# Patient Record
Sex: Male | Born: 1945 | Race: Black or African American | Hispanic: No | Smoking: Former smoker
Health system: Southern US, Community
[De-identification: ages and names within clinical notes are randomized; demographics above are authoritative.]

## PROBLEM LIST (undated history)

## (undated) DIAGNOSIS — Z992 Dependence on renal dialysis: Secondary | ICD-10-CM

## (undated) DIAGNOSIS — N186 End stage renal disease: Secondary | ICD-10-CM

## (undated) HISTORY — PX: ABOVE KNEE LEG AMPUTATION: SUR20

## (undated) HISTORY — PX: FINGER AMPUTATION: SHX636

---

## 2019-02-20 ENCOUNTER — Other Ambulatory Visit: Payer: Self-pay

## 2019-02-20 ENCOUNTER — Emergency Department (HOSPITAL_COMMUNITY): Payer: Medicare Other

## 2019-02-20 ENCOUNTER — Inpatient Hospital Stay (HOSPITAL_COMMUNITY)
Admission: EM | Admit: 2019-02-20 | Discharge: 2019-02-22 | DRG: 070 | Disposition: A | Discharge status: Skilled Nursing Facility | Payer: Medicare Other | Source: Skilled Nursing Facility | Attending: Internal Medicine | Admitting: Internal Medicine

## 2019-02-20 ENCOUNTER — Encounter (HOSPITAL_COMMUNITY): Payer: Self-pay

## 2019-02-20 DIAGNOSIS — Z23 Encounter for immunization: Secondary | ICD-10-CM | POA: Diagnosis present

## 2019-02-20 DIAGNOSIS — I10 Essential (primary) hypertension: Secondary | ICD-10-CM | POA: Diagnosis not present

## 2019-02-20 DIAGNOSIS — Z992 Dependence on renal dialysis: Secondary | ICD-10-CM | POA: Diagnosis not present

## 2019-02-20 DIAGNOSIS — N2581 Secondary hyperparathyroidism of renal origin: Secondary | ICD-10-CM | POA: Diagnosis present

## 2019-02-20 DIAGNOSIS — D631 Anemia in chronic kidney disease: Secondary | ICD-10-CM | POA: Diagnosis present

## 2019-02-20 DIAGNOSIS — Z7401 Bed confinement status: Secondary | ICD-10-CM | POA: Diagnosis not present

## 2019-02-20 DIAGNOSIS — N186 End stage renal disease: Secondary | ICD-10-CM | POA: Diagnosis present

## 2019-02-20 DIAGNOSIS — N189 Chronic kidney disease, unspecified: Secondary | ICD-10-CM | POA: Diagnosis not present

## 2019-02-20 DIAGNOSIS — Z89612 Acquired absence of left leg above knee: Secondary | ICD-10-CM | POA: Diagnosis not present

## 2019-02-20 DIAGNOSIS — I4821 Permanent atrial fibrillation: Secondary | ICD-10-CM | POA: Diagnosis present

## 2019-02-20 DIAGNOSIS — G3289 Other specified degenerative disorders of nervous system in diseases classified elsewhere: Secondary | ICD-10-CM | POA: Diagnosis present

## 2019-02-20 DIAGNOSIS — E1122 Type 2 diabetes mellitus with diabetic chronic kidney disease: Secondary | ICD-10-CM | POA: Diagnosis present

## 2019-02-20 DIAGNOSIS — Z79899 Other long term (current) drug therapy: Secondary | ICD-10-CM

## 2019-02-20 DIAGNOSIS — U071 COVID-19: Secondary | ICD-10-CM | POA: Diagnosis present

## 2019-02-20 DIAGNOSIS — Z933 Colostomy status: Secondary | ICD-10-CM | POA: Diagnosis not present

## 2019-02-20 DIAGNOSIS — Z87891 Personal history of nicotine dependence: Secondary | ICD-10-CM

## 2019-02-20 DIAGNOSIS — Z833 Family history of diabetes mellitus: Secondary | ICD-10-CM

## 2019-02-20 DIAGNOSIS — F0150 Vascular dementia without behavioral disturbance: Secondary | ICD-10-CM | POA: Diagnosis present

## 2019-02-20 DIAGNOSIS — E1129 Type 2 diabetes mellitus with other diabetic kidney complication: Secondary | ICD-10-CM | POA: Diagnosis present

## 2019-02-20 DIAGNOSIS — G9341 Metabolic encephalopathy: Principal | ICD-10-CM | POA: Diagnosis present

## 2019-02-20 DIAGNOSIS — R5381 Other malaise: Secondary | ICD-10-CM | POA: Diagnosis present

## 2019-02-20 DIAGNOSIS — Z7901 Long term (current) use of anticoagulants: Secondary | ICD-10-CM | POA: Diagnosis not present

## 2019-02-20 DIAGNOSIS — E875 Hyperkalemia: Secondary | ICD-10-CM | POA: Diagnosis present

## 2019-02-20 DIAGNOSIS — F0151 Vascular dementia with behavioral disturbance: Secondary | ICD-10-CM | POA: Diagnosis not present

## 2019-02-20 DIAGNOSIS — I120 Hypertensive chronic kidney disease with stage 5 chronic kidney disease or end stage renal disease: Secondary | ICD-10-CM | POA: Diagnosis present

## 2019-02-20 DIAGNOSIS — I69354 Hemiplegia and hemiparesis following cerebral infarction affecting left non-dominant side: Secondary | ICD-10-CM | POA: Diagnosis not present

## 2019-02-20 DIAGNOSIS — R410 Disorientation, unspecified: Secondary | ICD-10-CM

## 2019-02-20 DIAGNOSIS — Z89021 Acquired absence of right finger(s): Secondary | ICD-10-CM | POA: Diagnosis not present

## 2019-02-20 DIAGNOSIS — F1011 Alcohol abuse, in remission: Secondary | ICD-10-CM | POA: Diagnosis present

## 2019-02-20 DIAGNOSIS — R4182 Altered mental status, unspecified: Secondary | ICD-10-CM | POA: Diagnosis present

## 2019-02-20 DIAGNOSIS — R7989 Other specified abnormal findings of blood chemistry: Secondary | ICD-10-CM | POA: Diagnosis present

## 2019-02-20 DIAGNOSIS — I248 Other forms of acute ischemic heart disease: Secondary | ICD-10-CM | POA: Diagnosis present

## 2019-02-20 DIAGNOSIS — G40909 Epilepsy, unspecified, not intractable, without status epilepticus: Secondary | ICD-10-CM | POA: Diagnosis present

## 2019-02-20 DIAGNOSIS — R778 Other specified abnormalities of plasma proteins: Secondary | ICD-10-CM | POA: Diagnosis not present

## 2019-02-20 HISTORY — DX: End stage renal disease: N18.6

## 2019-02-20 HISTORY — DX: Dependence on renal dialysis: Z99.2

## 2019-02-20 LAB — COMPREHENSIVE METABOLIC PANEL
ALT: 25 U/L (ref 0–44)
AST: 12 U/L — ABNORMAL LOW (ref 15–41)
Albumin: 3.7 g/dL (ref 3.5–5.0)
Alkaline Phosphatase: 58 U/L (ref 38–126)
Anion gap: 17 — ABNORMAL HIGH (ref 5–15)
BUN: 115 mg/dL — ABNORMAL HIGH (ref 8–23)
CO2: 17 mmol/L — ABNORMAL LOW (ref 22–32)
Calcium: 8.4 mg/dL — ABNORMAL LOW (ref 8.9–10.3)
Chloride: 105 mmol/L (ref 98–111)
Creatinine, Ser: 13.82 mg/dL — ABNORMAL HIGH (ref 0.61–1.24)
GFR calc Af Amer: 4 mL/min — ABNORMAL LOW (ref >60)
GFR calc non Af Amer: 3 mL/min — ABNORMAL LOW (ref >60)
Glucose, Bld: 91 mg/dL (ref 70–99)
Potassium: 5.6 mmol/L — ABNORMAL HIGH (ref 3.5–5.1)
Sodium: 139 mmol/L (ref 135–145)
Total Bilirubin: 0.8 mg/dL (ref 0.3–1.2)
Total Protein: 7.5 g/dL (ref 6.5–8.1)

## 2019-02-20 LAB — CBC WITH DIFFERENTIAL/PLATELET
Abs Immature Granulocytes: 0.02 10*3/uL (ref 0.00–0.07)
Basophils Absolute: 0 10*3/uL (ref 0.0–0.1)
Basophils Relative: 1 %
Eosinophils Absolute: 0.1 10*3/uL (ref 0.0–0.5)
Eosinophils Relative: 4 %
HCT: 28.1 % — ABNORMAL LOW (ref 39.0–52.0)
Hemoglobin: 8.9 g/dL — ABNORMAL LOW (ref 13.0–17.0)
Immature Granulocytes: 1 %
Lymphocytes Relative: 37 %
Lymphs Abs: 1.3 10*3/uL (ref 0.7–4.0)
MCH: 31.3 pg (ref 26.0–34.0)
MCHC: 31.7 g/dL (ref 30.0–36.0)
MCV: 98.9 fL (ref 80.0–100.0)
Monocytes Absolute: 0.3 10*3/uL (ref 0.1–1.0)
Monocytes Relative: 8 %
Neutro Abs: 1.8 10*3/uL (ref 1.7–7.7)
Neutrophils Relative %: 49 %
Platelets: 154 10*3/uL (ref 150–400)
RBC: 2.84 MIL/uL — ABNORMAL LOW (ref 4.22–5.81)
RDW: 13.9 % (ref 11.5–15.5)
WBC: 3.5 10*3/uL — ABNORMAL LOW (ref 4.0–10.5)
nRBC: 0 % (ref 0.0–0.2)

## 2019-02-20 LAB — I-STAT CHEM 8, ED
BUN: 116 mg/dL — ABNORMAL HIGH (ref 8–23)
Calcium, Ion: 0.92 mmol/L — ABNORMAL LOW (ref 1.15–1.40)
Chloride: 109 mmol/L (ref 98–111)
Creatinine, Ser: 14.2 mg/dL — ABNORMAL HIGH (ref 0.61–1.24)
Glucose, Bld: 87 mg/dL (ref 70–99)
HCT: 28 % — ABNORMAL LOW (ref 39.0–52.0)
Hemoglobin: 9.5 g/dL — ABNORMAL LOW (ref 13.0–17.0)
Potassium: 5.3 mmol/L — ABNORMAL HIGH (ref 3.5–5.1)
Sodium: 138 mmol/L (ref 135–145)
TCO2: 15 mmol/L — ABNORMAL LOW (ref 22–32)

## 2019-02-20 LAB — PHOSPHORUS: Phosphorus: 5.5 mg/dL — ABNORMAL HIGH (ref 2.5–4.6)

## 2019-02-20 LAB — PROTIME-INR
INR: 1.2 (ref 0.8–1.2)
Prothrombin Time: 14.7 seconds (ref 11.4–15.2)

## 2019-02-20 LAB — LACTIC ACID, PLASMA: Lactic Acid, Venous: 1 mmol/L (ref 0.5–1.9)

## 2019-02-20 LAB — MAGNESIUM: Magnesium: 2.1 mg/dL (ref 1.7–2.4)

## 2019-02-20 LAB — SARS CORONAVIRUS 2 (TAT 6-24 HRS): SARS Coronavirus 2: POSITIVE — AB

## 2019-02-20 LAB — ETHANOL: Alcohol, Ethyl (B): <10 mg/dL (ref <10)

## 2019-02-20 LAB — TROPONIN I (HIGH SENSITIVITY): Troponin I (High Sensitivity): 19 ng/L — ABNORMAL HIGH (ref <18)

## 2019-02-20 MED ORDER — ENOXAPARIN SODIUM 30 MG/0.3ML ~~LOC~~ SOLN
30.0000 mg | Freq: Every day | SUBCUTANEOUS | Status: DC
  Filled 2019-02-20: qty 0.3

## 2019-02-20 MED ORDER — ACETAMINOPHEN 325 MG PO TABS: 650.0000 mg | ORAL_TABLET | Freq: Four times a day (QID) | ORAL | Status: DC | PRN

## 2019-02-20 MED ORDER — ONDANSETRON HCL 4 MG PO TABS: 4.0000 mg | ORAL_TABLET | Freq: Four times a day (QID) | ORAL | Status: DC | PRN

## 2019-02-20 MED ORDER — ALBUTEROL SULFATE HFA 108 (90 BASE) MCG/ACT IN AERS
2.0000 | INHALATION_SPRAY | Freq: Four times a day (QID) | RESPIRATORY_TRACT | Status: DC
  Administered 2019-02-21 – 2019-02-22 (×6): 2 via RESPIRATORY_TRACT
  Filled 2019-02-20 (×3): qty 6.7

## 2019-02-20 MED ORDER — SODIUM CHLORIDE 0.9% FLUSH: 3.0000 mL | INTRAVENOUS | Status: DC | PRN

## 2019-02-20 MED ORDER — SODIUM CHLORIDE 0.9% FLUSH
3.0000 mL | Freq: Two times a day (BID) | INTRAVENOUS | Status: DC
  Administered 2019-02-20: 22:00:00 3 mL via INTRAVENOUS

## 2019-02-20 MED ORDER — ONDANSETRON HCL 4 MG/2ML IJ SOLN: 4.0000 mg | Freq: Four times a day (QID) | INTRAMUSCULAR | Status: DC | PRN

## 2019-02-20 MED ORDER — SODIUM ZIRCONIUM CYCLOSILICATE 10 G PO PACK
10.0000 g | PACK | Freq: Once | ORAL | Status: AC
  Administered 2019-02-20: 10 g via ORAL
  Filled 2019-02-20: qty 1

## 2019-02-20 MED ORDER — HYDROCODONE-ACETAMINOPHEN 5-325 MG PO TABS: 1.0000 | ORAL_TABLET | ORAL | Status: DC | PRN

## 2019-02-20 MED ORDER — SODIUM CHLORIDE 0.9% FLUSH
3.0000 mL | Freq: Two times a day (BID) | INTRAVENOUS | Status: DC
  Administered 2019-02-20 – 2019-02-22 (×4): 3 mL via INTRAVENOUS

## 2019-02-20 MED ORDER — GUAIFENESIN-DM 100-10 MG/5ML PO SYRP: 10.0000 mL | ORAL_SOLUTION | ORAL | Status: DC | PRN

## 2019-02-20 MED ORDER — SODIUM CHLORIDE 0.9 % IV SOLN: 250.0000 mL | INTRAVENOUS | Status: DC | PRN

## 2019-02-20 MED ORDER — INSULIN ASPART 100 UNIT/ML ~~LOC~~ SOLN
0.0000 [IU] | SUBCUTANEOUS | Status: DC
  Administered 2019-02-21: 1 [IU] via SUBCUTANEOUS

## 2019-02-20 NOTE — ED Triage Notes (Signed)
Patient arrives via EMS from [facility] [facility]. EMS was initially called for patient to be transported to dialysis, however, while in transport patient was noted to be altered, unable to sit/bend, and would not keep his mask on (COVID+). Tues/Thurs/Sat dialysis schedule.  Patient was transported here for dialysis treatment. A&O x1 on arrival.

## 2019-02-20 NOTE — ED Provider Notes (Addendum)
[facility] EMERGENCY DEPARTMENT Provider Note   CSN: [id number] Arrival date & time: 02/20/19  1400     History   Chief Complaint Chief Complaint  Patient presents with  . Altered Mental Status  . COVID  . Dialysis    HPI Logan Singh is a 73 y.o. male.     HPI Patient is sent with very limited amount of history.  He was at [facility].  Reportedly he is a new resident there and only has been there for 4 days.  He has ESRD and is on dialysis.  Apparently he missed dialysis Thursday for unknown reasons.  He was being transported today when during transport it was determined that his mental status was altered and he was brought to the emergency department.  Patient reportedly had COVID.  Unknown when that testing was.  At this time, there is not additional information in the current EMR.  It is unclear right now where he was hospitalized and subsequently transferred to [facility].  The patient is a poor historian.  He denies that he is having any pain.  He reports he does not really know where we are.  He is not sure if this is the hospital.  He thinks it might be January.  He is talking and answering questions but it is unclear if he has some dementia versus  delirium.  He is talking about how things were when he was in school.  His sentences are clear but not relevant to the current situation.  He clearly is objecting to having IV sticks and vocalizing clear and normal commentary. Past Medical History:  Diagnosis Date  . Dialysis patient ([place])   . End-stage renal disease (ESRD) ([place])     There are no active problems to display for this patient.   Past Surgical History:  Procedure Laterality Date  . ABOVE KNEE LEG AMPUTATION Left   . FINGER AMPUTATION Right         Home Medications    Prior to Admission medications   Not on File    Family History History reviewed. No pertinent family history.  Social History Social History    Tobacco Use  . Smoking status: Former Research scientist (life sciences)  . Smokeless tobacco: Never Used  Substance Use Topics  . Alcohol use: Not on file  . Drug use: Not on file     Allergies   Patient has no known allergies.   Review of Systems Review of Systems Level 5 caveat cannot obtain review of systems due to patient confusion or dementia.  Physical Exam Updated Vital Signs BP (!) 197/66 (BP Location: Left Arm)   Pulse 74   Temp 97.9 F (36.6 C) (Oral)   Resp (!) 22   Ht 5\' 10"  (1.778 m)   Wt 72.6 kg   SpO2 98%   BMI 22.96 kg/m   Physical Exam Constitutional:      Comments: Patient is alert.  He is not in any respiratory distress.  He is talking in a normal voice.  He does appear deconditioned, thin and debilitated.  HENT:     Head: Normocephalic and atraumatic.     Mouth/Throat:     Mouth: Mucous membranes are moist.     Pharynx: Oropharynx is clear.  Eyes:     Extraocular Movements: Extraocular movements intact.  Cardiovascular:     Comments: Heart sounds distant.  Regular.  I cannot appreciate rub murmur gallop at this time.  Patient is however  talking fairly constantly. Pulmonary:     Comments: No respiratory distress.  To anterior auscultation no gross rales or crackle.  The patient will not sit up for his exam.  He is physically stiffening his body and not allowing the nurse as an Environmental consultant and myself to put him in a sitting position in an upright position.  Will need additional staff to forcibly roll him up on his side. Abdominal:     Comments: Abdomen is soft and nondistended.  He has multiple old scars.  He does not describe pain or objective abdominal exam.  Musculoskeletal:     Comments: AKA on the left.  Right lower extremity is present.  Very thin.  Skin is thinned and trace edema.  Musculature of the lower extremity is very atrophic.  Skin:    General: Skin is warm and dry.  Neurological:     Comments: Patient is aware of his surroundings.  He is communicating with  myself and his nurse.  He is not appropriately answering any historical questions.  He can purposefully move both upper extremities.  He does not have any obvious focal motor deficit but he is very deconditioned with a lower extremity amputation, arthritis and general atrophy of extremity musculature.      ED Treatments / Results  Labs (all labs ordered are listed, but only abnormal results are displayed) Labs Reviewed  CULTURE, BLOOD (ROUTINE X 2)  CULTURE, BLOOD (ROUTINE X 2)  SARS CORONAVIRUS 2 (TAT 6-24 HRS)  COMPREHENSIVE METABOLIC PANEL  ETHANOL  LACTIC ACID, PLASMA  LACTIC ACID, PLASMA  CBC WITH DIFFERENTIAL/PLATELET  PROTIME-INR  MAGNESIUM  PHOSPHORUS  I-STAT CHEM 8, ED  TROPONIN I (HIGH SENSITIVITY)    EKG None  Radiology No results found.  Procedures Procedures (including critical care time)  Medications Ordered in ED Medications - No data to display   Initial Impression / Assessment and Plan / ED Course  I have reviewed the triage vital signs and the nursing notes.  Pertinent labs & imaging results that were available during my care of the patient were reviewed by me and considered in my medical decision making (see chart for details).       Patient is brought to the emergency department with very limited history at this time.  He is alert without respiratory distress.  He is ostensibly on route to dialysis.  Report is that he is or has been positive for COVID.  Will initiate general work-up.  Will need to try to obtain more history from SNF facility.  Reportedly the patient had altered mental status but it is unclear what the baseline mental status is.  Final Clinical Impressions(s) / ED Diagnoses   Final diagnoses:  ESRD needing dialysis [facility])  Confusion    ED Discharge Orders    None       Charlesetta Shanks, MD 02/20/19 1440    Charlesetta Shanks, MD 02/20/19 1443

## 2019-02-20 NOTE — ED Notes (Signed)
Spoke to [employer]. Patient's home facility is [facility]. [facility] days ago due to [place] have a Covid Virus outbreak.   Brother[place]: [id number]

## 2019-02-20 NOTE — H&P (Signed)
[place] [id number] DOB: [birth year] DOA: 02/20/2019     PCP: Mendel Corning   Outpatient Specialists:      Patient arrived to ER on 02/20/19 at 1400  Patient coming from:   From facility [facility] nursing facility   Chief Complaint:   Chief Complaint  Patient presents with   Altered Mental Status   COVID   Dialysis    HPI: Logan Singh is a 73 y.o. male with medical history significant of COVID positive on ESRD Tue/Thurs/Sat, DM2, HTN, stroke, Dementia, sp colostostomy, sp left AKA    Presented with   Acute encephalopathy  Originally from [place] was at [facility] due to CVA with residual weakness on the left, at baseline bed bound, Diagnosed COVID yesterday per family he was transferred to [facility] Patient was on his way to receive his regular dialysis today as it is Saturday but EMS noted that he was very confused which family states he has been having intermittent episodes of confusion ever since his stroke.  EMS turned around and brought patient here to [facility] Hx of tobacco abuse and ETOH abuse non since 2017   Infectious risk factors:  Reports none  POSITIVE from [facility] yesterday in  ER RAPID COVID TEST Positive  Lab Results  Component Value Date   [place] (A) 02/20/2019     Regarding pertinent Chronic problems:    HTN on unsure what medications he takes    DM 2 - unclear if on insulin  Hx of CVA -  With residual deficits      ESRD - on HD since 2015 on [place], [place], [place] Lab Results  Component Value Date   CREATININE 14.20 (H) 02/20/2019   CREATININE 13.82 (H) 02/20/2019    While in ER: Noted to be hyperkalemic was treated nephrology was consulted plan to dialyze tomorrow plan to admit for further work-up and treatment of COVID The following Work up has been ordered so far:  Orders Placed This Encounter  Procedures   Culture, blood (routine x 2)   SARS CORONAVIRUS 2 (TAT 6-24 HRS) Nasopharyngeal  Nasopharyngeal Swab   DG Chest Port 1 View   CT Head Wo Contrast   Comprehensive metabolic panel   Ethanol   Lactic acid, plasma   CBC with Differential   Protime-INR   Magnesium   Phosphorus   Consult to nephrology  ALL PATIENTS BEING ADMITTED/HAVING PROCEDURES NEED COVID-19 SCREENING   Consult for Unassigned Medical Admission  ALL PATIENTS BEING ADMITTED/HAVING PROCEDURES NEED COVID-19 SCREENING   I-stat chem 8, ED (not at [facility] or [facility])   Saline lock IV     Following Medications were ordered in ER: Medications  sodium zirconium cyclosilicate (LOKELMA) packet 10 g (has no administration in time range)      ER Provider Called:     Dr.[place]  They Recommend admit to medicine  Will see in AM   Significant initial  Findings: Abnormal Labs Reviewed  COMPREHENSIVE METABOLIC PANEL - Abnormal; Notable for the following components:      Result Value   Potassium 5.6 (*)    CO2 17 (*)    BUN 115 (*)    Creatinine, Ser 13.82 (*)    Calcium 8.4 (*)    AST 12 (*)    GFR calc non Af Amer 3 (*)    GFR calc Af Amer 4 (*)    Anion gap 17 (*)    All other components within normal limits  CBC WITH DIFFERENTIAL/PLATELET - Abnormal; Notable for the following components:   WBC 3.5 (*)    RBC 2.84 (*)    Hemoglobin 8.9 (*)    HCT 28.1 (*)    All other components within normal limits  PHOSPHORUS - Abnormal; Notable for the following components:   Phosphorus 5.5 (*)    All other components within normal limits  I-STAT CHEM 8, ED - Abnormal; Notable for the following components:   Potassium 5.3 (*)    BUN 116 (*)    Creatinine, Ser 14.20 (*)    Calcium, Ion 0.92 (*)    TCO2 15 (*)    Hemoglobin 9.5 (*)    HCT 28.0 (*)    All other components within normal limits  TROPONIN I (HIGH SENSITIVITY) - Abnormal; Notable for the following components:   Troponin I (High Sensitivity) 19 (*)    All other components within normal limits     Otherwise labs showing:    Recent  Labs  Lab 02/20/19 1821 02/20/19 1833  NA 139 138  K 5.6* 5.3*  CO2 17*  --   GLUCOSE 91 87  BUN 115* 116*  CREATININE 13.82* 14.20*  CALCIUM 8.4*  --   MG 2.1  --   PHOS 5.5*  --     Cr  Up from baseline see below Lab Results  Component Value Date   CREATININE 14.20 (H) 02/20/2019   CREATININE 13.82 (H) 02/20/2019    Recent Labs  Lab 02/20/19 1821  AST 12*  ALT 25  ALKPHOS 58  BILITOT 0.8  PROT 7.5  ALBUMIN 3.7   Lab Results  Component Value Date   CALCIUM 8.4 (L) 02/20/2019   PHOS 5.5 (H) 02/20/2019     WBC      Component Value Date/Time   WBC 3.5 (L) 02/20/2019 1821   ANC    Component Value Date/Time   NEUTROABS 1.8 02/20/2019 1821   ALC No components found for: LYMPHAB    Plt: Lab Results  Component Value Date   PLT 154 02/20/2019     Lactic Acid, Venous    Component Value Date/Time   LATICACIDVEN 1.0 02/20/2019 1821    Procalcitonin  Ordered   COVID-19 Labs  No results for input(s): DDIMER, FERRITIN, LDH, CRP in the last 72 hours.  No results found for: SARSCOV2NAA    HG/HCT  stable,       Component Value Date/Time   HGB 9.5 (L) 02/20/2019 1833   HCT 28.0 (L) 02/20/2019 1833    Troponin  19     ECG: Ordered Personally reviewed by me showing: HR : 69  Rhythm:   NSR    no evidence of ischemic changes QTC 504    UA anuric       Ordered  CT HEAD remote right MCA territory infarct.   CXR -  NON acute     ED Triage Vitals  Enc Vitals Group     BP 02/20/19 1407 (!) 197/66     Pulse Rate 02/20/19 1407 74     Resp 02/20/19 1407 (!) 22     Temp 02/20/19 1407 97.9 F (36.6 C)     Temp Source 02/20/19 1407 Oral     SpO2 02/20/19 1407 98 %     Weight 02/20/19 1408 160 lb (72.6 kg)     Height 02/20/19 1408 5\' 10"  (1.778 m)     Head Circumference --      Peak Flow --  Pain Score 02/20/19 1415 0     Pain Loc --      Pain Edu? --      Excl. in [place]? --   TMAX(24)@       Latest  Blood pressure (!) 159/76,  pulse 65, temperature 97.9 F (36.6 C), temperature source Oral, resp. rate 14, height 5\' 10"  (1.778 m), weight 72.6 kg, SpO2 98 %.    Hospitalist was called for admission for Need for HD   Review of Systems:    Pertinent positives include: confusion, fatigue  Constitutional:  No weight loss, night sweats, Fevers, chills, weight loss  HEENT:  No headaches, Difficulty swallowing,Tooth/dental problems,Sore throat,  No sneezing, itching, ear ache, nasal congestion, post nasal drip,  Cardio-vascular:  No chest pain, Orthopnea, PND, anasarca, dizziness, palpitations.no Bilateral lower extremity swelling  GI:  No heartburn, indigestion, abdominal pain, nausea, vomiting, diarrhea, change in bowel habits, loss of appetite, melena, blood in stool, hematemesis Resp:  no shortness of breath at rest. No dyspnea on exertion, No excess mucus, no productive cough, No non-productive cough, No coughing up of blood.No change in color of mucus.No wheezing. Skin:  no rash or lesions. No jaundice GU:  no dysuria, change in color of urine, no urgency or frequency. No straining to urinate.  No flank pain.  Musculoskeletal:  No joint pain or no joint swelling. No decreased range of motion. No back pain.  Psych:  No change in mood or affect. No depression or anxiety. No memory loss.  Neuro: no localizing neurological complaints, no tingling, no weakness, no double vision, no gait abnormality, no slurred speech,   All systems reviewed and apart from [place] all are negative  Past Medical History:   Past Medical History:  Diagnosis Date   Dialysis patient ([place])    End-stage renal disease (ESRD) ([place])       Past Surgical History:  Procedure Laterality Date   ABOVE KNEE LEG AMPUTATION Left    FINGER AMPUTATION Right     Social History:  Ambulatory   bed bound     reports that he has quit smoking. He has never used smokeless tobacco. No history on file for alcohol and drug.   Family  History:   Family History  Problem Relation Age of Onset   Diabetes Other     Allergies: No Known Allergies   Prior to Admission medications   Not on File   Physical Exam: Blood pressure (!) 159/76, pulse 65, temperature 97.9 F (36.6 C), temperature source Oral, resp. rate 14, height 5\' 10"  (1.778 m), weight 72.6 kg, SpO2 98 %. 1. General:  in No  Acute distress   Chronically ill  -appearing 2. Psychological: Alert and not Oriented 3. Head/ENT:   Moist   Mucous Membranes                          Head Non traumatic, neck supple                           Poor Dentition 4. SKIN:  decreased Skin turgor,  Skin clean Dry and intact no rash 5. Heart: Regular rate and rhythm no Murmur, no Rub or gallop 6. Lungs: no wheezes or crackles   7. Abdomen: Soft,  non-tender, Non distended   bowel sounds present 8. Lower extremities: no clubbing, cyanosis, no  Edema sp Left AKA 9. Neurologically not following commands appears to be  pleasantly confused Spontaneously moving all 3 extremities 10. MSK: Normal range of motion   All other LABS:     Recent Labs  Lab 02/20/19 1821 02/20/19 1833  WBC 3.5*  --   NEUTROABS 1.8  --   HGB 8.9* 9.5*  HCT 28.1* 28.0*  MCV 98.9  --   PLT 154  --      Recent Labs  Lab 02/20/19 1821 02/20/19 1833  NA 139 138  K 5.6* 5.3*  CL 105 109  CO2 17*  --   GLUCOSE 91 87  BUN 115* 116*  CREATININE 13.82* 14.20*  CALCIUM 8.4*  --   MG 2.1  --   PHOS 5.5*  --      Recent Labs  Lab 02/20/19 1821  AST 12*  ALT 25  ALKPHOS 58  BILITOT 0.8  PROT 7.5  ALBUMIN 3.7       Cultures: No results found for: SDES, SPECREQUEST, CULT, REPTSTATUS   Radiological Exams on Admission: Ct Head Wo Contrast  Result Date: 02/20/2019 CLINICAL DATA:  Altered level of consciousness today. EXAM: CT HEAD WITHOUT CONTRAST TECHNIQUE: Contiguous axial images were obtained from the base of the skull through the vertex without intravenous contrast. COMPARISON:   None. FINDINGS: Brain: No evidence of acute infarction, hemorrhage, hydrocephalus, extra-axial collection or mass lesion/mass effect. Atrophy, extensive chronic microvascular ischemic change and remote large right MCA infarct noted. Vascular: No hyperdense vessel or unexpected calcification. Skull: No acute or focal abnormality. Sinuses/Orbits: Negative. Other: None. IMPRESSION: No acute abnormality. Atrophy, chronic microvascular ischemic change and large, remote right MCA territory infarct. Electronically Signed   By: Inge Rise M.D.   On: 02/20/2019 16:55   Dg Chest Port 1 View  Result Date: 02/20/2019 CLINICAL DATA:  The patient missed dialysis 2 days ago. EXAM: PORTABLE CHEST 1 VIEW COMPARISON:  None. FINDINGS: Lungs clear. Heart size normal. No pneumothorax or pleural fluid. No acute or focal bony abnormality. IMPRESSION: No acute disease. Electronically Signed   By: Inge Rise M.D.   On: 02/20/2019 16:53    Chart has been reviewed    Assessment/Plan   73 y.o. male with medical history significant of COVID positive on ESRD Tue/Thurs/Sat, DM2, HTN, stroke, Dementia, sp colostomy, sp LEFT AKA  Admitted for acute encephalopathy in the setting of COVID  Present on Admission:  COVID-19 virus infection -   FROM SNF WITH KNOWN HX OF COVID19 Initial Rapid Novel Corona Virus testing:  Ordered 02/20/19 and is pending     -Following work-up initiated:        sputum cultures  Ordered 02/20/19, Blood cultures  Ordered 02/20/19, repeat  Novel Corona Virus testing:  Ordered      Following complications noted:  elevated troponin noted likely demand ischemia for clearance in the setting of end-stage renal disease will continue to follow  will be monitoring for evidence of other complications such as PE/DVT CVA or  cardiovascular events  Plan of treatment: -Admit to [facility] given the patient is dialysis - if develops hypoxia would initiate steroids Solu-Medrol and  remdesivir  currently on room air - Will follow daily d.dimer - Assess for ability to prone  - Supportive management    -Provide oxygen as needed currently on   SpO2: 98 % - IF d.dimer elvated >5 will increase dose of lovenox    Poor Prognostic factors  73 y.o.  Personal hx of DM2, , HTN,   Evidence of  organ damage  Present  elevated  trop   Airborne precautions ordered     Will Lobbyist precautions  Family/ patient prognosis discussion:  I have asked case with the family/ patient  who are aware of patient's prognosis At this point they would like patient to be full code     ESRD (end stage renal disease) ([place]) -nephrology aware will plan to dialyze tomorrow   Hyperkalemia -treated in the emergency department will continue monitor on telemetry plan to dialyze tomorrow   Vascular dementia ([place]) -unclear if current worsening in the setting of COVID versus ex sundowning in the setting of chronic dementia we will continue to monitor   Anemia due to chronic kidney disease -check anemia panel   DM (diabetes mellitus) type II controlled with renal manifestation ([place]) -   - Order Sensitive SSI   -  check TSH and HgA1C Will need to obtain medication list from [place] hypertension -resume home medications when confirmed from [facility] In the past possibly was on hydralazine 25 mg metoprolol 50 mg amlodipine 10 mg isosorbide dinitrate 20 mg Also propranolol 10 mg unsure which of those medications are currently active   Acute metabolic encephalopathy-most likely sundowning in the setting of chronic vascular dementia. P review of records he at some point has been on 500 mg of Keppra unsure if once a day twice a day family did not mention any history of seizure but if patient has history of seizures this needs to be restarted   Elevated troponin-no chest pain patient with known history of end-stage renal disease likely for clearance no evidence of  ischemia at this time   Other plan as per orders.  DVT prophylaxis:   Lovenox     Code Status:  FULL CODE   as per family  I had personally discussed CODE STATUS with   Family     Family Communication:   Family not at  Bedside  plan of care was discussed on the phone with   Brother ,   Disposition Plan:                             Back to current facility when stable                                                                Social Work  consulted                                  Consults called: nephrology  Admission status:  ED Disposition    None          inpatient     Expect 2 midnight stay secondary to severity of patient's current illness including     Severe lab/radiological/exam abnormalities including:     and extensive comorbidities including:    DM2      CKD/ESRD  dementia    history of stroke with residual deficits     That are currently affecting medical management.   I expect  patient to be hospitalized for 2 midnights requiring inpatient medical care.  Patient is at high risk for adverse outcome (such as loss  of life or disability) if not treated.  Indication for inpatient stay as follows:  Severe change from baseline regarding mental status   Diminished ability to maintain oral hydration    New or worsening hypoxia  Need for HD     Level of care     tele  For 24H     Precautions:   airborne Airborne and Contact precautions  PPE: Used by the provider:   P100  eye Goggles,  Gloves  gown      Somaya Grassi 02/21/2019, 1:18 AM    [facility]     after 2 AM please page floor coverage PA If 7AM-7PM, please contact the day team taking care of the patient using [email or web address]

## 2019-02-20 NOTE — ED Notes (Signed)
Got patient on the monitor did ekg shown to Dr Edman Circle patient is resting with call bell in reach

## 2019-02-20 NOTE — ED Notes (Addendum)
Attempted to draw blood/place IV x2 with no success. IV team consulted.

## 2019-02-20 NOTE — ED Notes (Signed)
Attempted to give medication, patient stated "I don't want no fucking more" after three swallows of medication.

## 2019-02-20 NOTE — ED Provider Notes (Signed)
  Physical Exam  BP (!) 159/76   Pulse 65   Temp 97.9 F (36.6 C) (Oral)   Resp 14   Ht 5\' 10"  (1.778 m)   Wt 72.6 kg   SpO2 98%   BMI 22.96 kg/m   Physical Exam  ED Course/Procedures     Procedures  MDM  Received patient in signout.  Was being sent from [place] facility to dialysis.  Reportedly had mental status change brought in by EMS.  Reportedly is covid positive.  Has only been at [facility] for a few days.  Reportedly was not dialyzed on Thursday either.  Mild hyperkalemia.  X-ray reassuring.  Too confused to really writing much history.  Will discuss with nephrology and likely admission the hospital.  Discussed with Dr. Moshe Cipro.  Will give Lokelma 10 mg here.  Likely dialysis tomorrow.  Admit to internal medicine.      Davonna Belling, MD 02/20/19 2107

## 2019-02-21 DIAGNOSIS — Z89612 Acquired absence of left leg above knee: Secondary | ICD-10-CM

## 2019-02-21 DIAGNOSIS — G9341 Metabolic encephalopathy: Principal | ICD-10-CM

## 2019-02-21 LAB — CBC
HCT: 25 % — ABNORMAL LOW (ref 39.0–52.0)
Hemoglobin: 8.7 g/dL — ABNORMAL LOW (ref 13.0–17.0)
MCH: 32.3 pg (ref 26.0–34.0)
MCHC: 34.8 g/dL (ref 30.0–36.0)
MCV: 92.9 fL (ref 80.0–100.0)
Platelets: 157 10*3/uL (ref 150–400)
RBC: 2.69 MIL/uL — ABNORMAL LOW (ref 4.22–5.81)
RDW: 14 % (ref 11.5–15.5)
WBC: 2.5 10*3/uL — ABNORMAL LOW (ref 4.0–10.5)
nRBC: 0 % (ref 0.0–0.2)

## 2019-02-21 LAB — COMPREHENSIVE METABOLIC PANEL
ALT: 21 U/L (ref 0–44)
AST: 10 U/L — ABNORMAL LOW (ref 15–41)
Albumin: 3.5 g/dL (ref 3.5–5.0)
Alkaline Phosphatase: 59 U/L (ref 38–126)
Anion gap: 18 — ABNORMAL HIGH (ref 5–15)
BUN: 117 mg/dL — ABNORMAL HIGH (ref 8–23)
CO2: 19 mmol/L — ABNORMAL LOW (ref 22–32)
Calcium: 8.6 mg/dL — ABNORMAL LOW (ref 8.9–10.3)
Chloride: 105 mmol/L (ref 98–111)
Creatinine, Ser: 14.4 mg/dL — ABNORMAL HIGH (ref 0.61–1.24)
GFR calc Af Amer: 3 mL/min — ABNORMAL LOW (ref >60)
GFR calc non Af Amer: 3 mL/min — ABNORMAL LOW (ref >60)
Glucose, Bld: 92 mg/dL (ref 70–99)
Potassium: 5.9 mmol/L — ABNORMAL HIGH (ref 3.5–5.1)
Sodium: 142 mmol/L (ref 135–145)
Total Bilirubin: 0.9 mg/dL (ref 0.3–1.2)
Total Protein: 7.3 g/dL (ref 6.5–8.1)

## 2019-02-21 LAB — CBC WITH DIFFERENTIAL/PLATELET
Abs Immature Granulocytes: 0.01 10*3/uL (ref 0.00–0.07)
Basophils Absolute: 0 10*3/uL (ref 0.0–0.1)
Basophils Relative: 0 %
Eosinophils Absolute: 0.1 10*3/uL (ref 0.0–0.5)
Eosinophils Relative: 4 %
HCT: 27.1 % — ABNORMAL LOW (ref 39.0–52.0)
Hemoglobin: 8.8 g/dL — ABNORMAL LOW (ref 13.0–17.0)
Immature Granulocytes: 0 %
Lymphocytes Relative: 48 %
Lymphs Abs: 1.2 10*3/uL (ref 0.7–4.0)
MCH: 31.8 pg (ref 26.0–34.0)
MCHC: 32.5 g/dL (ref 30.0–36.0)
MCV: 97.8 fL (ref 80.0–100.0)
Monocytes Absolute: 0.2 10*3/uL (ref 0.1–1.0)
Monocytes Relative: 7 %
Neutro Abs: 1.1 10*3/uL — ABNORMAL LOW (ref 1.7–7.7)
Neutrophils Relative %: 41 %
Platelets: 164 10*3/uL (ref 150–400)
RBC: 2.77 MIL/uL — ABNORMAL LOW (ref 4.22–5.81)
RDW: 13.9 % (ref 11.5–15.5)
WBC: 2.6 10*3/uL — ABNORMAL LOW (ref 4.0–10.5)
nRBC: 0 % (ref 0.0–0.2)

## 2019-02-21 LAB — IRON AND TIBC
Iron: 71 ug/dL (ref 45–182)
Saturation Ratios: 39 % (ref 17.9–39.5)
TIBC: 182 ug/dL — ABNORMAL LOW (ref 250–450)
UIBC: 111 ug/dL

## 2019-02-21 LAB — RETICULOCYTES
Immature Retic Fract: 3.6 % (ref 2.3–15.9)
RBC.: 2.77 MIL/uL — ABNORMAL LOW (ref 4.22–5.81)
Retic Count, Absolute: 13.6 10*3/uL — ABNORMAL LOW (ref 19.0–186.0)
Retic Ct Pct: 0.5 % (ref 0.4–3.1)

## 2019-02-21 LAB — GLUCOSE, CAPILLARY
Glucose-Capillary: 132 mg/dL — ABNORMAL HIGH (ref 70–99)
Glucose-Capillary: 71 mg/dL (ref 70–99)
Glucose-Capillary: 71 mg/dL (ref 70–99)
Glucose-Capillary: 98 mg/dL (ref 70–99)

## 2019-02-21 LAB — HEMOGLOBIN A1C
Hgb A1c MFr Bld: 5.7 % — ABNORMAL HIGH (ref 4.8–5.6)
Mean Plasma Glucose: 116.89 mg/dL

## 2019-02-21 LAB — CBG MONITORING, ED
Glucose-Capillary: 79 mg/dL (ref 70–99)
Glucose-Capillary: 72 mg/dL (ref 70–99)

## 2019-02-21 LAB — PROCALCITONIN: Procalcitonin: 0.25 ng/mL

## 2019-02-21 LAB — FOLATE: Folate: 13.2 ng/mL (ref >5.9)

## 2019-02-21 LAB — MAGNESIUM: Magnesium: 2.1 mg/dL (ref 1.7–2.4)

## 2019-02-21 LAB — C-REACTIVE PROTEIN: CRP: <0.8 mg/dL (ref <1.0)

## 2019-02-21 LAB — FERRITIN: Ferritin: 697 ng/mL — ABNORMAL HIGH (ref 24–336)

## 2019-02-21 LAB — FIBRINOGEN: Fibrinogen: 430 mg/dL (ref 210–475)

## 2019-02-21 LAB — VITAMIN B12: Vitamin B-12: 1017 pg/mL — ABNORMAL HIGH (ref 180–914)

## 2019-02-21 LAB — LACTATE DEHYDROGENASE: LDH: 168 U/L (ref 98–192)

## 2019-02-21 LAB — D-DIMER, QUANTITATIVE: D-Dimer, Quant: 2.67 ug/mL-FEU — ABNORMAL HIGH (ref 0.00–0.50)

## 2019-02-21 LAB — ABO/RH: ABO/RH(D): O POS

## 2019-02-21 LAB — HEPATITIS B SURFACE ANTIGEN: Hepatitis B Surface Ag: NONREACTIVE

## 2019-02-21 MED ORDER — CHLORHEXIDINE GLUCONATE CLOTH 2 % EX PADS
6.0000 | MEDICATED_PAD | Freq: Every day | CUTANEOUS | Status: DC
  Administered 2019-02-21 – 2019-02-22 (×2): 6 via TOPICAL

## 2019-02-21 MED ORDER — APIXABAN 2.5 MG PO TABS
2.5000 mg | ORAL_TABLET | Freq: Two times a day (BID) | ORAL | Status: DC
  Administered 2019-02-21 – 2019-02-22 (×3): 2.5 mg via ORAL
  Filled 2019-02-21 (×3): qty 1

## 2019-02-21 MED ORDER — ATORVASTATIN CALCIUM 40 MG PO TABS
40.0000 mg | ORAL_TABLET | Freq: Every evening | ORAL | Status: DC
  Administered 2019-02-21: 40 mg via ORAL
  Filled 2019-02-21: qty 1

## 2019-02-21 MED ORDER — PENTAFLUOROPROP-TETRAFLUOROETH EX AERO: 1.0000 "application " | INHALATION_SPRAY | CUTANEOUS | Status: DC | PRN

## 2019-02-21 MED ORDER — MIDODRINE HCL 5 MG PO TABS: 10.0000 mg | ORAL_TABLET | ORAL | Status: DC

## 2019-02-21 MED ORDER — SODIUM CHLORIDE 0.9 % IV SOLN: 100.0000 mL | INTRAVENOUS | Status: DC | PRN

## 2019-02-21 MED ORDER — HEPARIN SODIUM (PORCINE) 1000 UNIT/ML IJ SOLN
INTRAMUSCULAR | Status: AC
  Filled 2019-02-21: qty 4

## 2019-02-21 MED ORDER — ENSURE ENLIVE PO LIQD
237.0000 mL | Freq: Two times a day (BID) | ORAL | Status: DC
  Administered 2019-02-21 – 2019-02-22 (×2): 237 mL via ORAL
  Filled 2019-02-21: qty 237

## 2019-02-21 MED ORDER — LACOSAMIDE 50 MG PO TABS
50.0000 mg | ORAL_TABLET | Freq: Two times a day (BID) | ORAL | Status: DC
  Administered 2019-02-21 – 2019-02-22 (×3): 50 mg via ORAL
  Filled 2019-02-21 (×3): qty 1

## 2019-02-21 MED ORDER — HEPARIN SODIUM (PORCINE) 1000 UNIT/ML DIALYSIS: 1000.0000 [IU] | INTRAMUSCULAR | Status: DC | PRN

## 2019-02-21 MED ORDER — LEVETIRACETAM 500 MG PO TABS
1000.0000 mg | ORAL_TABLET | ORAL | Status: DC
  Administered 2019-02-21 – 2019-02-22 (×2): 1000 mg via ORAL
  Filled 2019-02-21 (×2): qty 2

## 2019-02-21 MED ORDER — PANTOPRAZOLE SODIUM 40 MG PO TBEC
40.0000 mg | DELAYED_RELEASE_TABLET | Freq: Every day | ORAL | Status: DC
  Administered 2019-02-21 – 2019-02-22 (×2): 40 mg via ORAL
  Filled 2019-02-21 (×2): qty 1

## 2019-02-21 MED ORDER — POLYETHYLENE GLYCOL 3350 17 G PO PACK
17.0000 g | PACK | Freq: Every day | ORAL | Status: DC | PRN
  Administered 2019-02-21: 17 g via ORAL
  Filled 2019-02-21: qty 1

## 2019-02-21 MED ORDER — LIDOCAINE HCL (PF) 1 % IJ SOLN: 5.0000 mL | INTRAMUSCULAR | Status: DC | PRN

## 2019-02-21 MED ORDER — SENNA 8.6 MG PO TABS
2.0000 | ORAL_TABLET | Freq: Every evening | ORAL | Status: DC
  Administered 2019-02-21: 17.2 mg via ORAL
  Filled 2019-02-21: qty 2

## 2019-02-21 MED ORDER — INFLUENZA VAC A&B SA ADJ QUAD 0.5 ML IM PRSY
0.5000 mL | PREFILLED_SYRINGE | INTRAMUSCULAR | Status: AC | PRN
  Administered 2019-02-22: 0.5 mL via INTRAMUSCULAR
  Filled 2019-02-21: qty 0.5

## 2019-02-21 MED ORDER — AMIODARONE HCL 200 MG PO TABS
200.0000 mg | ORAL_TABLET | Freq: Every day | ORAL | Status: DC
  Administered 2019-02-21 – 2019-02-22 (×2): 200 mg via ORAL
  Filled 2019-02-21 (×2): qty 1

## 2019-02-21 MED ORDER — ALTEPLASE 2 MG IJ SOLR: 2.0000 mg | Freq: Once | INTRAMUSCULAR | Status: DC | PRN

## 2019-02-21 MED ORDER — LIDOCAINE-PRILOCAINE 2.5-2.5 % EX CREA: 1.0000 "application " | TOPICAL_CREAM | CUTANEOUS | Status: DC | PRN

## 2019-02-21 MED ORDER — PNEUMOCOCCAL VAC POLYVALENT 25 MCG/0.5ML IJ INJ: 0.5000 mL | INJECTION | INTRAMUSCULAR | Status: DC | PRN

## 2019-02-21 NOTE — Consult Note (Addendum)
[facility] Renal Consultation Note    Indication for Consultation:  Management of ESRD/hemodialysis; anemia, hypertension/volume and secondary hyperparathyroidism  IB:[id number], [place]  HPI: Logan Singh is a 73 y.o. male.  History obtained through chart review due to patient COVID + status.  PMH significant for ESRD on HD TTS, DMT2, HTN, stroke, dementia s/p colostomy and s/p L AKA.    Patient was brought to the ED yesterday by EMS due to AMS.  EMS was called by his SNF to transport him to dialysis, but in route they felt he needed to be evaluated due to AMS.  He is a new resident to [facility], transferred from a SNF in [place] 4 days ago-we think due to [place] status.  Patient in [place] [facility] d/t CVA w/residual L sided weakness and is bed bound at baseline. Per family patient has been having intermittent episodes of confusion since the CVA.    Pertinent findings in the ED include COVID +, K 5.9, SCr 14.4, BUN 117, WBC 2.6, D dimer 2.67, CXR with no acute disease, CT head with no acute abnormalities.   Unsure which dialysis center he is supposed to attend as he is not listed in any of our facilities.  [place] believes it is one in [place] but unsure which facility.  Will call tomorrow when they open to obtain additional dialysis information.  Had heard that he has not had any dialysis since Tuesday   Past Medical History:  Diagnosis Date  . Dialysis patient ([facility])   . End-stage renal disease (ESRD) ([place])    Past Surgical History:  Procedure Laterality Date  . ABOVE KNEE LEG AMPUTATION Left   . FINGER AMPUTATION Right    Family History  Problem Relation Age of Onset  . Diabetes Other    Social History:  reports that he has quit smoking. He has never used smokeless tobacco. He reports previous alcohol use. No history on file for drug. No Known Allergies Prior to Admission medications   Medication Sig Start Date End Date Taking? Authorizing Provider   amiodarone (PACERONE) 200 MG tablet Take 200 mg by mouth daily.   Yes [provider]  apixaban (ELIQUIS) 2.5 MG TABS tablet Take 2.5 mg by mouth 2 (two) times daily. 02/18/19 03/04/19 Yes [provider]  atorvastatin (LIPITOR) 40 MG tablet Take 40 mg by mouth daily.   Yes [provider]  lacosamide (VIMPAT) 50 MG TABS tablet Take 50 mg by mouth 2 (two) times daily.   Yes [provider]  lactulose (CHRONULAC) 10 GM/15ML solution Take 80 g by mouth daily as needed (missed dialysis days).   Yes [provider]  lansoprazole (PREVACID) 30 MG capsule Take 30 mg by mouth daily at 12 noon.   Yes [provider]  levETIRAcetam (KEPPRA) 1000 MG tablet Take 1,000 mg by mouth every 12 (twelve) hours.   Yes [provider]  midodrine (PROAMATINE) 10 MG tablet Take 10 mg by mouth Every Tuesday,Thursday,and Saturday with dialysis.   Yes [provider]  polyethylene glycol (MIRALAX / GLYCOLAX) 17 g packet Take 17 g by mouth daily.   Yes [provider]  senna (SENOKOT) 8.6 MG TABS tablet Take 2 tablets by mouth every evening.   Yes [provider]   Current Facility-Administered Medications  Medication Dose Route Frequency Provider Last Rate Last Dose  . 0.9 %  sodium chloride infusion  250 mL Intravenous PRN Toy Baker, MD      .  acetaminophen (TYLENOL) tablet 650 mg  650 mg Oral Q6H PRN Doutova, Anastassia, MD      . albuterol (VENTOLIN HFA) 108 (90 Base) MCG/ACT inhaler 2 puff  2 puff Inhalation Q6H Doutova, Anastassia, MD   2 puff at 02/21/19 0214  . Chlorhexidine Gluconate Cloth 2 % PADS 6 each  6 each Topical Q0600 Penninger, Lindsay, PA      . enoxaparin (LOVENOX) injection 30 mg  30 mg Subcutaneous Daily Doutova, Anastassia, MD      . guaiFENesin-dextromethorphan (ROBITUSSIN DM) 100-10 MG/5ML syrup 10 mL  10 mL Oral Q4H PRN Doutova, Anastassia, MD      . HYDROcodone-acetaminophen (NORCO/VICODIN) 5-325  MG per tablet 1-2 tablet  1-2 tablet Oral Q4H PRN Doutova, Anastassia, MD      . insulin aspart (novoLOG) injection 0-9 Units  0-9 Units Subcutaneous Q4H Doutova, Anastassia, MD      . ondansetron (ZOFRAN) tablet 4 mg  4 mg Oral Q6H PRN Doutova, Anastassia, MD       Or  . ondansetron (ZOFRAN) injection 4 mg  4 mg Intravenous Q6H PRN Doutova, Anastassia, MD      . sodium chloride flush (NS) 0.9 % injection 3 mL  3 mL Intravenous Q12H Doutova, Anastassia, MD   3 mL at 02/20/19 2211  . sodium chloride flush (NS) 0.9 % injection 3 mL  3 mL Intravenous Q12H Toy Baker, MD   3 mL at 02/20/19 2211  . sodium chloride flush (NS) 0.9 % injection 3 mL  3 mL Intravenous PRN Toy Baker, MD       Current Outpatient Medications  Medication Sig Dispense Refill  . amiodarone (PACERONE) 200 MG tablet Take 200 mg by mouth daily.    Marland Kitchen apixaban (ELIQUIS) 2.5 MG TABS tablet Take 2.5 mg by mouth 2 (two) times daily.    Marland Kitchen atorvastatin (LIPITOR) 40 MG tablet Take 40 mg by mouth daily.    Marland Kitchen lacosamide (VIMPAT) 50 MG TABS tablet Take 50 mg by mouth 2 (two) times daily.    Marland Kitchen lactulose (CHRONULAC) 10 GM/15ML solution Take 80 g by mouth daily as needed (missed dialysis days).    . lansoprazole (PREVACID) 30 MG capsule Take 30 mg by mouth daily at 12 noon.    . levETIRAcetam (KEPPRA) 1000 MG tablet Take 1,000 mg by mouth every 12 (twelve) hours.    . midodrine (PROAMATINE) 10 MG tablet Take 10 mg by mouth Every Tuesday,Thursday,and Saturday with dialysis.    Marland Kitchen polyethylene glycol (MIRALAX / GLYCOLAX) 17 g packet Take 17 g by mouth daily.    Marland Kitchen senna (SENOKOT) 8.6 MG TABS tablet Take 2 tablets by mouth every evening.     Labs: Basic Metabolic Panel: Recent Labs  Lab 02/20/19 1821 02/20/19 1833 02/21/19 0241  NA 139 138 142  K 5.6* 5.3* 5.9*  CL 105 109 105  CO2 17*  --  19*  GLUCOSE 91 87 92  BUN 115* 116* 117*  CREATININE 13.82* 14.20* 14.40*  CALCIUM 8.4*  --  8.6*  PHOS 5.5*  --   --     Liver Function Tests: Recent Labs  Lab 02/20/19 1821 02/21/19 0241  AST 12* 10*  ALT 25 21  ALKPHOS 58 59  BILITOT 0.8 0.9  PROT 7.5 7.3  ALBUMIN 3.7 3.5   CBC: Recent Labs  Lab 02/20/19 1821 02/20/19 1833 02/21/19 0241  WBC 3.5*  --  2.6*  NEUTROABS 1.8  --  1.1*  HGB 8.9* 9.5* 8.8*  HCT  28.1* 28.0* 27.1*  MCV 98.9  --  97.8  PLT 154  --  164    CBG: Recent Labs  Lab 02/21/19 0105 02/21/19 0539  GLUCAP 79 72   Iron Studies:  Recent Labs    02/21/19 0241  IRON 71  TIBC 182*  FERRITIN 697*   Studies/Results: Ct Head Wo Contrast  Result Date: 02/20/2019 CLINICAL DATA:  Altered level of consciousness today. EXAM: CT HEAD WITHOUT CONTRAST TECHNIQUE: Contiguous axial images were obtained from the base of the skull through the vertex without intravenous contrast. COMPARISON:  None. FINDINGS: Brain: No evidence of acute infarction, hemorrhage, hydrocephalus, extra-axial collection or mass lesion/mass effect. Atrophy, extensive chronic microvascular ischemic change and remote large right MCA infarct noted. Vascular: No hyperdense vessel or unexpected calcification. Skull: No acute or focal abnormality. Sinuses/Orbits: Negative. Other: None. IMPRESSION: No acute abnormality. Atrophy, chronic microvascular ischemic change and large, remote right MCA territory infarct. Electronically Signed   By: Inge Rise M.D.   On: 02/20/2019 16:55   Dg Chest Port 1 View  Result Date: 02/20/2019 CLINICAL DATA:  The patient missed dialysis 2 days ago. EXAM: PORTABLE CHEST 1 VIEW COMPARISON:  None. FINDINGS: Lungs clear. Heart size normal. No pneumothorax or pleural fluid. No acute or focal bony abnormality. IMPRESSION: No acute disease. Electronically Signed   By: Inge Rise M.D.   On: 02/20/2019 16:53    ROS: Unable to complete ROS   Physical Exam: Vitals:   02/21/19 0530 02/21/19 0600 02/21/19 0645 02/21/19 0800  BP:  (!) 168/83  (!) 161/88  Pulse: 66 66 69 68   Resp: 18 13 15 18   Temp:      TempSrc:      SpO2: 100% 100% 100% 100%  Weight:      Height:         Physical exam: unable to complete due to COVID + status.  In order to preserve PPE equipment and to minimize exposure to providers.  Notes from other caregivers reviewed  Dialysis Orders:  Will call dialysis centers tomorrow for OP HD orders.   Assessment/Plan: 1.  AMS in setting of chronic vascular dementia - CT & CXR with no acute findings.  Waxing and waning mental status per family. BC ordered & sputum cultures ordered. Per primary.  Really unclear if this is an acute change 2. COVID + : admitted to [facility] d/t ESRD on HD. Per primary 3.  Hyperkalemia - given lokelma 10g overnight.  Plan for HD today in isolation.  4.  ESRD -  HD on TTS schedule. Missed OP HD yesterday because admitted to hospital.  Plan for HD today 3.5hrs and resume regular schedule on Tuesday. 5.  Hypertension/volume  - BP elevated. Expect improvement post HD. New UF goal 2-3L as tolerated.  Plan to resume home meds once confirmed with [facility].  6.  Anemia of CKD - Hgb 8.8. tsat 39%.  Will obtain OP orders tomorrow and assess if ESA needed.  Transfuse as indicated. 7.  Secondary Hyperparathyroidism -  Ca at goal. Will check phos. OP orders pending.  8.  Nutrition - Renal diet w/fluid restrictions.  9. DMT2 - per primary 10. Hx CVA - residual L sided weakness  Jen Mow, PA-C [facility] Pager: [phone] 02/21/2019, 10:16 AM   Patient seen and examined, agree with above note with above modifications.  73 year old nursing home resident with dementia and is bedbound with recent stroke.  He remains on his chronic dialysis.  He  has had a change nursing homes and change in dialysis centers of late secondary to a COVID diagnosis.  He was diverted from dialysis to the emergency room for mental status change and it is unclear if that was acute.  He had dialysis needs so dialysis treatment  performed today.  Next treatment would be planned for Tuesday.  According to primary note possible discharge tomorrow back to skilled nursing facility Corliss Parish, MD 02/21/2019

## 2019-02-21 NOTE — Progress Notes (Signed)
CSW attempted to contact the patient's brother, Cordai Litterer to complete the assessment. CSW was not able to leave a voicemail.   CSW called and left a voicemail with Terri Piedra, Renal Navigator. Patient is from [facility] and Nephrology is unsure about dialysis center and days.   The patient's PASSR number did not match in [place]. CSW will call Monday to get proper PASSR number.   CSW will continue to follow and assist with disposition planning.   Domenic Schwab, MSW, [place] Worker [facility]  [phone]

## 2019-02-21 NOTE — ED Notes (Signed)
Tele Covid +  Breakfast ordered

## 2019-02-21 NOTE — Progress Notes (Signed)
CSW acknowledges consult for SNF. The patient is from [facility]. CSW will start workup.   Domenic Schwab, MSW, [place] Worker [facility]  [phone]

## 2019-02-21 NOTE — Progress Notes (Addendum)
PROGRESS NOTE    Logan Singh  [id number] DOB: 1945/11/08 DOA: 02/20/2019 PCP: Mendel Corning   Brief Narrative:  Patient is a 25 male with history of ESRD on dialysis, diabetes, hypertension, stroke, vascular dementia, status post colostomy who was brought out from [facility] facility for the evaluation of altered mental status.  Patient usually resides at [facility]  but recently moved to [facility] facility because of covid outbreak.  Assessment & Plan:   Active Problems:   COVID-19 virus infection   ESRD (end stage renal disease) (HCC)   Hyperkalemia   Vascular dementia (HCC)   Anemia due to chronic kidney disease   DM (diabetes mellitus) type II controlled with renal manifestation (HCC)   Essential hypertension   Acute metabolic encephalopathy   Elevated troponin   Hx of AKA (above knee amputation), left (HCC)   Altered mental status: History of vascular dementia, history of a stroke.  As per family he is intermittently confused.  Currently he looks like he is at baseline.  He does not have any signs or symptoms of CNS infection.  CT head did not show any acute intracranial abnormalities.Showed trophy, chronic microvascular ischemic change and large, remote right MCA territory infarct.I donot suspect he has ongoing seizures.  COVID-19: Recently diagnosed with COVID.  Asymptomatic.  Chest x-ray did not show any pneumonia.  Currently saturating fine on room air.  Not started on steroids or remdesivir.  His inflammatory markers are negative except for mildly elevated ddimer.   Continue airborne precaution.  ESRD on dialysis: Dialyzed on TTS.  Nephrology has been consulted.  Presented with hyperkalemia.  Given lokelma.Undergoing dialysis.  Permanent A. fib: Currently rate is controlled.  On Eliquis for anticoagulation.  Also on amiodarone  History of seizure disorder: On Vimpat and Keppra  Mild elevated troponin: Most likely  secondary to demand ischemia from initial renal disease.  Diabetes type 2: Continue sliding-scale insulin for now.  Hemoglobin A1c of 5.7.Not on medications at home.  Hypertension: Currently blood pressure stable.  Continue current medicines  History of CVA: Continue home medicines.  History of seizures: On Keppra.  Normocytic anemia: Associated with CKD.  Currently H&H stable.  Debility/deconditioning: Originally from [facility] due to CVA with residual weakness on the left, at baseline bed bound.  Demented at baseline.  Status post left AKA         DVT prophylaxis:Heparin [place] Code Status: Full Family Communication: Called brother,call not received Disposition Plan: Likely back to SnF tomorrow   Consultants: Nephrology  Procedures:None  Antimicrobials:  Anti-infectives (From admission, onward)   None      Subjective:  Patient seen and examined the bedside this morning in the emergency department.  Hemodynamically stable.  Comfortable.  Confused but alert and awake.  Was not in any kind of distress.  Tries to communicate by speaking words.  Objective: Vitals:   02/21/19 1235 02/21/19 1245 02/21/19 1250 02/21/19 1315  BP: (!) 162/86 (!) 150/86 (!) 160/88 140/80  Pulse: 96 96 90   Resp: 14 14    Temp:      TempSrc:      SpO2:      Weight:      Height:       No intake or output data in the 24 hours ending 02/21/19 1316 Filed Weights   02/20/19 1408  Weight: 72.6 kg    Examination:  General exam: Appears calm and comfortable ,Not in distress,average  built, very deconditioned/debilitated HEENT:PERRL,Oral mucosa moist, Ear/Nose normal on gross exam Respiratory system: Bilateral equal air entry, normal vesicular breath sounds, no wheezes or crackles  Cardiovascular system: S1 & S2 heard, RRR. No JVD, murmurs, rubs, gallops or clicks. No pedal edema. Gastrointestinal system: Abdomen is nondistended, soft and nontender. No organomegaly or  masses felt. Colostomy Central nervous system: Alert, awake but not oriented  extremities: No edema, no clubbing ,no cyanosis,Left AKA, amputation of 2 fingers of the right hand  skin: No rashes, lesions ,no icterus ,no pallor   Data Reviewed: I have personally reviewed following labs and imaging studies  CBC: Recent Labs  Lab 02/20/19 1821 02/20/19 1833 02/21/19 0241  WBC 3.5*  --  2.6*  NEUTROABS 1.8  --  1.1*  HGB 8.9* 9.5* 8.8*  HCT 28.1* 28.0* 27.1*  MCV 98.9  --  97.8  PLT 154  --  123456   Basic Metabolic Panel: Recent Labs  Lab 02/20/19 1821 02/20/19 1833 02/21/19 0241  NA 139 138 142  K 5.6* 5.3* 5.9*  CL 105 109 105  CO2 17*  --  19*  GLUCOSE 91 87 92  BUN 115* 116* 117*  CREATININE 13.82* 14.20* 14.40*  CALCIUM 8.4*  --  8.6*  MG 2.1  --  2.1  PHOS 5.5*  --   --    GFR: Estimated Creatinine Clearance: 4.8 mL/min (A) (by C-G formula based on SCr of 14.4 mg/dL (H)). Liver Function Tests: Recent Labs  Lab 02/20/19 1821 02/21/19 0241  AST 12* 10*  ALT 25 21  ALKPHOS 58 59  BILITOT 0.8 0.9  PROT 7.5 7.3  ALBUMIN 3.7 3.5   No results for input(s): LIPASE, AMYLASE in the last 168 hours. No results for input(s): AMMONIA in the last 168 hours. Coagulation Profile: Recent Labs  Lab 02/20/19 1821  INR 1.2   Cardiac Enzymes: No results for input(s): CKTOTAL, CKMB, CKMBINDEX, TROPONINI in the last 168 hours. BNP (last 3 results) No results for input(s): PROBNP in the last 8760 hours. HbA1C: Recent Labs    02/21/19 0241  HGBA1C 5.7*   CBG: Recent Labs  Lab 02/21/19 0105 02/21/19 0539 02/21/19 1214  GLUCAP 79 72 71   Lipid Profile: No results for input(s): CHOL, HDL, LDLCALC, TRIG, CHOLHDL, LDLDIRECT in the last 72 hours. Thyroid Function Tests: No results for input(s): TSH, T4TOTAL, FREET4, T3FREE, THYROIDAB in the last 72 hours. Anemia Panel: Recent Labs    02/21/19 0241  VITAMINB12 1,017*  FOLATE 13.2  FERRITIN 697*  TIBC 182*   IRON 71  RETICCTPCT 0.5   Sepsis Labs: Recent Labs  Lab 02/20/19 1821 02/21/19 0241  PROCALCITON  --  0.25  LATICACIDVEN 1.0  --     Recent Results (from the past 240 hour(s))  SARS CORONAVIRUS 2 (TAT 6-24 HRS) Nasopharyngeal Nasopharyngeal Swab     Status: Abnormal   Collection Time: 02/20/19  2:31 PM   Specimen: Nasopharyngeal Swab  Result Value Ref Range Status   SARS Coronavirus 2 POSITIVE (A) NEGATIVE Final    Comment: RESULT CALLED TO, READ BACK BY AND VERIFIED WITH: B. OLDLAND,RN 2252 02/20/2019 T. TYSOR (NOTE) SARS-CoV-2 target nucleic acids are DETECTED. The SARS-CoV-2 RNA is generally detectable in upper and lower respiratory specimens during the acute phase of infection. Positive results are indicative of active infection with SARS-CoV-2. Clinical  correlation with patient history and other diagnostic information is necessary to determine patient infection status. Positive results do  not rule out bacterial infection or  co-infection with other viruses. The expected result is Negative. Fact Sheet for Patients: SugarRoll.be Fact Sheet for Healthcare Providers: https://www.woods-mathews.com/ This test is not yet approved or cleared by the [place] FDA and  has been authorized for detection and/or diagnosis of SARS-CoV-2 by FDA under an Emergency Use Authorization (EUA). This EUA will remain  in effect (meaning this test can be used) fo r the duration of the COVID-19 declaration under Section 564(b)(1) of the Act, 21 U.S.C. section 360bbb-3(b)(1), unless the authorization is terminated or revoked sooner. Performed at [facility], [place] [address], [place], [place] [address]          Radiology Studies: Ct Head Wo Contrast  Result Date: 02/20/2019 CLINICAL DATA:  Altered level of consciousness today. EXAM: CT HEAD WITHOUT CONTRAST TECHNIQUE: Contiguous axial images were obtained from the base of the skull  through the vertex without intravenous contrast. COMPARISON:  None. FINDINGS: Brain: No evidence of acute infarction, hemorrhage, hydrocephalus, extra-axial collection or mass lesion/mass effect. Atrophy, extensive chronic microvascular ischemic change and remote large right MCA infarct noted. Vascular: No hyperdense vessel or unexpected calcification. Skull: No acute or focal abnormality. Sinuses/Orbits: Negative. Other: None. IMPRESSION: No acute abnormality. Atrophy, chronic microvascular ischemic change and large, remote right MCA territory infarct. Electronically Signed   By: Inge Rise M.D.   On: 02/20/2019 16:55   Dg Chest Port 1 View  Result Date: 02/20/2019 CLINICAL DATA:  The patient missed dialysis 2 days ago. EXAM: PORTABLE CHEST 1 VIEW COMPARISON:  None. FINDINGS: Lungs clear. Heart size normal. No pneumothorax or pleural fluid. No acute or focal bony abnormality. IMPRESSION: No acute disease. Electronically Signed   By: Inge Rise M.D.   On: 02/20/2019 16:53        Scheduled Meds: . heparin      . albuterol  2 puff Inhalation Q6H  . Chlorhexidine Gluconate Cloth  6 each Topical Q0600  . enoxaparin (LOVENOX) injection  30 mg Subcutaneous Daily  . insulin aspart  0-9 Units Subcutaneous Q4H  . sodium chloride flush  3 mL Intravenous Q12H   Continuous Infusions: . sodium chloride    . sodium chloride    . sodium chloride       LOS: 1 day    Time spent: 35 mins.More than 50% of that time was spent in counseling and/or coordination of care.      Shelly Coss, MD [facility] Pager [phone]  If 7PM-7AM, please contact night-coverage [email or web address] Password [email or web address] 02/21/2019, 1:16 PM

## 2019-02-21 NOTE — Plan of Care (Signed)
New admit/ AMS. Neither progression or digression of outcome noted at this time.

## 2019-02-21 NOTE — NC FL2 (Signed)
[place] LEVEL OF CARE SCREENING TOOL     IDENTIFICATION  Patient Name: Logan Singh Birthdate: August 14, 1945 Sex: male Admission Date (Current Location): 02/20/2019  [facility] and [place] Number:  Herbalist and Address:  [facility] [place], [place] [address], [place], [place] [address]      Provider Number: [id number]  Attending Physician Name and Address:  Shelly Coss, MD  Relative Name and Phone Number:  Arba Wootan, Johnathan Hausen, [phone]    Current Level of Care: Hospital Recommended Level of Care: [place] Prior Approval Number:    Date Approved/Denied:   PASRR Number:    Discharge Plan: SNF    Current Diagnoses: Patient Active Problem List   Diagnosis Date Noted  . Hx of AKA (above knee amputation), left ([place]) 02/21/2019  . COVID-19 virus infection 02/20/2019  . ESRD (end stage renal disease) ([place]) 02/20/2019  . Hyperkalemia 02/20/2019  . Vascular dementia ([place]) 02/20/2019  . Anemia due to chronic kidney disease 02/20/2019  . DM (diabetes mellitus) type II controlled with renal manifestation ([place]) 02/20/2019  . Essential hypertension 02/20/2019  . Acute metabolic encephalopathy [id number]  . Elevated troponin 02/20/2019    Orientation RESPIRATION BLADDER Height & Weight     Self  Normal Continent, External catheter Weight: 154 lb 5.2 oz (70 kg) Height:  5\' 10"  (177.8 cm)  BEHAVIORAL SYMPTOMS/MOOD NEUROLOGICAL BOWEL NUTRITION STATUS      Colostomy, Continent Diet(Renal/carb modified diet, thin liquids, fluid restriction 1253ml)  AMBULATORY STATUS COMMUNICATION OF NEEDS Skin   Supervision Verbally Normal(Dry skin)                       Personal Care Assistance Level of Assistance  Bathing, Feeding, Dressing, Total care Bathing Assistance: Limited assistance Feeding assistance: Independent Dressing Assistance: Limited assistance Total Care Assistance: Limited assistance   Functional  Limitations Info  Sight, Hearing, Speech Sight Info: Adequate Hearing Info: Adequate Speech Info: Adequate    SPECIAL CARE FACTORS FREQUENCY                       Contractures Contractures Info: Not present    Additional Factors Info  Code Status, Allergies, Insulin Sliding Scale, Isolation Precautions Code Status Info: Full Code Allergies Info: No Known Allergies   Insulin Sliding Scale Info: insulin aspart novolog 0-9 units every 4 hours Isolation Precautions Info: COVID +     Current Medications (02/21/2019):  This is the current hospital active medication list Current Facility-Administered Medications  Medication Dose Route Frequency Provider Last Rate Last Dose  . 0.9 %  sodium chloride infusion  250 mL Intravenous PRN Doutova, Anastassia, MD      . 0.9 %  sodium chloride infusion  100 mL Intravenous PRN Penninger, Ria Comment, PA      . 0.9 %  sodium chloride infusion  100 mL Intravenous PRN Penninger, Ria Comment, PA      . acetaminophen (TYLENOL) tablet 650 mg  650 mg Oral Q6H PRN Doutova, Anastassia, MD      . albuterol (VENTOLIN HFA) 108 (90 Base) MCG/ACT inhaler 2 puff  2 puff Inhalation Q6H Doutova, Anastassia, MD   2 puff at 02/21/19 1624  . alteplase (CATHFLO ACTIVASE) injection 2 mg  2 mg Intracatheter Once PRN Penninger, Ria Comment, PA      . amiodarone (PACERONE) tablet 200 mg  200 mg Oral Daily Shelly Coss, MD   200 mg at 02/21/19 1619  . apixaban (  ELIQUIS) tablet 2.5 mg  2.5 mg Oral BID Shelly Coss, MD   2.5 mg at 02/21/19 1620  . atorvastatin (LIPITOR) tablet 40 mg  40 mg Oral QPM Shelly Coss, MD   40 mg at 02/21/19 1619  . Chlorhexidine Gluconate Cloth 2 % PADS 6 each  6 each Topical Q0600 Penninger, Lindsay, PA   6 each at 02/21/19 1140  . feeding supplement (ENSURE ENLIVE) (ENSURE ENLIVE) liquid 237 mL  237 mL Oral BID BM Adhikari, Amrit, MD   237 mL at 02/21/19 1616  . guaiFENesin-dextromethorphan (ROBITUSSIN DM) 100-10 MG/5ML syrup 10 mL  10 mL Oral  Q4H PRN Doutova, Anastassia, MD      . heparin 1000 UNIT/ML injection           . HYDROcodone-acetaminophen (NORCO/VICODIN) 5-325 MG per tablet 1-2 tablet  1-2 tablet Oral Q4H PRN Doutova, Anastassia, MD      . influenza vaccine adjuvanted (FLUAD) injection 0.5 mL  0.5 mL Intramuscular Prior to discharge Adhikari, Amrit, MD      . insulin aspart (novoLOG) injection 0-9 Units  0-9 Units Subcutaneous Q4H Doutova, Anastassia, MD      . lacosamide (VIMPAT) tablet 50 mg  50 mg Oral BID Shelly Coss, MD   50 mg at 02/21/19 1618  . levETIRAcetam (KEPPRA) tablet 1,000 mg  1,000 mg Oral Q24H Shelly Coss, MD   1,000 mg at 02/21/19 1619  . lidocaine (PF) (XYLOCAINE) 1 % injection 5 mL  5 mL Intradermal PRN Penninger, Ria Comment, PA      . lidocaine-prilocaine (EMLA) cream 1 application  1 application Topical PRN Penninger, Ria Comment, PA      . [START ON 02/23/2019] midodrine (PROAMATINE) tablet 10 mg  10 mg Oral Q T,Th,Sa-HD Adhikari, Amrit, MD      . ondansetron (ZOFRAN) tablet 4 mg  4 mg Oral Q6H PRN Doutova, Anastassia, MD       Or  . ondansetron (ZOFRAN) injection 4 mg  4 mg Intravenous Q6H PRN Doutova, Anastassia, MD      . pantoprazole (PROTONIX) EC tablet 40 mg  40 mg Oral Daily Shelly Coss, MD   40 mg at 02/21/19 1619  . pentafluoroprop-tetrafluoroeth (GEBAUERS) aerosol 1 application  1 application Topical PRN Penninger, Ria Comment, [place]      . pneumococcal 23 valent vaccine (PNU-IMMUNE) injection 0.5 mL  0.5 mL Intramuscular Prior to discharge Adhikari, Amrit, MD      . polyethylene glycol (MIRALAX / GLYCOLAX) packet 17 g  17 g Oral Daily PRN Shelly Coss, MD   17 g at 02/21/19 1620  . senna (SENOKOT) tablet 17.2 mg  2 tablet Oral QPM Adhikari, Amrit, MD   17.2 mg at 02/21/19 1622  . sodium chloride flush (NS) 0.9 % injection 3 mL  3 mL Intravenous Q12H Doutova, Anastassia, MD   3 mL at 02/21/19 1200  . sodium chloride flush (NS) 0.9 % injection 3 mL  3 mL Intravenous PRN Toy Baker, MD          Discharge Medications: Please see discharge summary for a list of discharge medications.  Relevant Imaging Results:  Relevant Lab Results:   Additional Information SSN: [id number]; HD  Philippa Chester Edona Schreffler, LCSWA

## 2019-02-22 LAB — C-REACTIVE PROTEIN: CRP: 0.9 mg/dL (ref <1.0)

## 2019-02-22 LAB — CBC WITH DIFFERENTIAL/PLATELET
Abs Immature Granulocytes: 0.01 10*3/uL (ref 0.00–0.07)
Basophils Absolute: 0 10*3/uL (ref 0.0–0.1)
Basophils Relative: 0 %
Eosinophils Absolute: 0.1 10*3/uL (ref 0.0–0.5)
Eosinophils Relative: 3 %
HCT: 26.9 % — ABNORMAL LOW (ref 39.0–52.0)
Hemoglobin: 8.6 g/dL — ABNORMAL LOW (ref 13.0–17.0)
Immature Granulocytes: 0 %
Lymphocytes Relative: 33 %
Lymphs Abs: 0.9 10*3/uL (ref 0.7–4.0)
MCH: 31.3 pg (ref 26.0–34.0)
MCHC: 32 g/dL (ref 30.0–36.0)
MCV: 97.8 fL (ref 80.0–100.0)
Monocytes Absolute: 0.3 10*3/uL (ref 0.1–1.0)
Monocytes Relative: 12 %
Neutro Abs: 1.4 10*3/uL — ABNORMAL LOW (ref 1.7–7.7)
Neutrophils Relative %: 52 %
Platelets: 166 10*3/uL (ref 150–400)
RBC: 2.75 MIL/uL — ABNORMAL LOW (ref 4.22–5.81)
RDW: 13.8 % (ref 11.5–15.5)
WBC: 2.7 10*3/uL — ABNORMAL LOW (ref 4.0–10.5)
nRBC: 0 % (ref 0.0–0.2)

## 2019-02-22 LAB — COMPREHENSIVE METABOLIC PANEL
ALT: 19 U/L (ref 0–44)
AST: 13 U/L — ABNORMAL LOW (ref 15–41)
Albumin: 3.5 g/dL (ref 3.5–5.0)
Alkaline Phosphatase: 61 U/L (ref 38–126)
Anion gap: 14 (ref 5–15)
BUN: 46 mg/dL — ABNORMAL HIGH (ref 8–23)
CO2: 25 mmol/L (ref 22–32)
Calcium: 8.6 mg/dL — ABNORMAL LOW (ref 8.9–10.3)
Chloride: 100 mmol/L (ref 98–111)
Creatinine, Ser: 7.81 mg/dL — ABNORMAL HIGH (ref 0.61–1.24)
GFR calc Af Amer: 7 mL/min — ABNORMAL LOW (ref >60)
GFR calc non Af Amer: 6 mL/min — ABNORMAL LOW (ref >60)
Glucose, Bld: 91 mg/dL (ref 70–99)
Potassium: 4.3 mmol/L (ref 3.5–5.1)
Sodium: 139 mmol/L (ref 135–145)
Total Bilirubin: 0.8 mg/dL (ref 0.3–1.2)
Total Protein: 7.2 g/dL (ref 6.5–8.1)

## 2019-02-22 LAB — MAGNESIUM: Magnesium: 2.2 mg/dL (ref 1.7–2.4)

## 2019-02-22 LAB — GLUCOSE, CAPILLARY
Glucose-Capillary: 85 mg/dL (ref 70–99)
Glucose-Capillary: 85 mg/dL (ref 70–99)
Glucose-Capillary: 76 mg/dL (ref 70–99)
Glucose-Capillary: 93 mg/dL (ref 70–99)

## 2019-02-22 LAB — D-DIMER, QUANTITATIVE: D-Dimer, Quant: 2.38 ug/mL-FEU — ABNORMAL HIGH (ref 0.00–0.50)

## 2019-02-22 LAB — FERRITIN: Ferritin: 841 ng/mL — ABNORMAL HIGH (ref 24–336)

## 2019-02-22 MED ORDER — LEVETIRACETAM 1000 MG PO TABS: 1000.0000 mg | ORAL_TABLET | Freq: Every day | ORAL | Status: AC

## 2019-02-22 NOTE — Progress Notes (Signed)
CBG taken in L hand & earlobe - 85. Poor perfusion to right hand.

## 2019-02-22 NOTE — Progress Notes (Signed)
Renal Navigator spoke with CSW who confirms transportation from SNF to OP HD. Renal Navigator notified OP HD clinic/[facility] to expect patient for treatment tomorrow.  Alphonzo Cruise, [place] Renal Navigator [phone]

## 2019-02-22 NOTE — Progress Notes (Signed)
Patient ID: Logan Singh, male   DOB: Sep 04, 1945, 73 y.o.   MRN: [id number]  [facility] Progress Note    Subjective:   Plans are to discharge pt to SNF and has outpatient HD at [facility] in [place]   Objective:   BP 130/72 (BP Location: Left Arm)   Pulse 81   Temp 98.3 F (36.8 C) (Axillary)   Resp 15   Ht 5\' 10"  (1.778 m)   Wt 70 kg   SpO2 99%   BMI 22.14 kg/m   Intake/Output: I/O last 3 completed shifts: In: -  Out: 2200 [Other:2000; Stool:200]   Intake/Output this shift:  Total I/O In: -  Out: 200 [Urine:150; Stool:50] Weight change: -0.576 kg  Physical exam: unable to complete due to COVID + status.  In order to preserve PPE equipment and to minimize exposure to providers.  Notes from other caregivers reviewed  Labs: BMET Recent Labs  Lab 02/20/19 1821 02/20/19 1833 02/21/19 0241 02/22/19 0541  NA 139 138 142 139  K 5.6* 5.3* 5.9* 4.3  CL 105 109 105 100  CO2 17*  --  19* 25  GLUCOSE 91 87 92 91  BUN 115* 116* 117* 46*  CREATININE 13.82* 14.20* 14.40* 7.81*  ALBUMIN 3.7  --  3.5 3.5  CALCIUM 8.4*  --  8.6* 8.6*  PHOS 5.5*  --   --   --    CBC Recent Labs  Lab 02/20/19 1821 02/20/19 1833 02/21/19 0241 02/21/19 1256 02/22/19 0541  WBC 3.5*  --  2.6* 2.5* 2.7*  NEUTROABS 1.8  --  1.1*  --  1.4*  HGB 8.9* 9.5* 8.8* 8.7* 8.6*  HCT 28.1* 28.0* 27.1* 25.0* 26.9*  MCV 98.9  --  97.8 92.9 97.8  PLT 154  --  164 157 166    @IMGRELPRIORS @ Medications:    . albuterol  2 puff Inhalation Q6H  . amiodarone  200 mg Oral Daily  . apixaban  2.5 mg Oral BID  . atorvastatin  40 mg Oral QPM  . Chlorhexidine Gluconate Cloth  6 each Topical Q0600  . feeding supplement (ENSURE ENLIVE)  237 mL Oral BID BM  . insulin aspart  0-9 Units Subcutaneous Q4H  . lacosamide  50 mg Oral BID  . levETIRAcetam  1,000 mg Oral Q24H  . [START ON 02/23/2019] midodrine  10 mg Oral Q T,Th,Sa-HD  . pantoprazole  40 mg Oral Daily  . senna  2  tablet Oral QPM  . sodium chloride flush  3 mL Intravenous Q12H     Assessment/ Plan:   1. AMS- in setting of chronic vascular dementia.  Workup negative thus far and apparently back to baseline 2. ESRD TTS at [facility] already set up.had HD yesterday. 3. Anemia: resume outpatient ESA 4. CKD-MBD: resume OP meds 5. Nutrition: renal diet, carb modified 6. Hypertension: stable 7. Disposition: pt to be discharged to [facility] if transportation to OP HD unit can be verified.   Donetta Potts, MD [place] Pager [phone] 02/22/2019, 2:27 PM

## 2019-02-22 NOTE — TOC Transition Note (Signed)
Transition of Care [facility]) - CM/SW Discharge Note   Patient Details  Name: Logan Singh MRN: [id number] Date of Birth: December 25, 1945  Transition of Care [facility]) CM/SW Contact:  Geralynn Ochs, LCSW Phone Number: 02/22/2019, 1:55 PM   Clinical Narrative:   Nurse to call report to [phone], X [phone] (Special Care Unit). Going to room 105.    Final next level of care: Skilled Nursing Facility Barriers to Discharge: Barriers Resolved   Patient Goals and CMS Choice        Discharge Placement              Patient chooses bed at: [facility] Patient to be transferred to facility by: [place] Name of family member notified: Unable to reach brother Patient and family notified of of transfer: 02/22/19  Discharge Plan and Services                                     Social Determinants of Health ([place]) Interventions     Readmission Risk Interventions Readmission Risk Prevention Plan 02/22/2019  Transportation Screening Complete  PCP or Specialist Appt within 3-5 Days Complete  HRI or [place] Not Complete  HRI or Home Care Consult comments Going to SNF  Social Work Consult for [place] Planning/Counseling Complete  Palliative Care Screening Not Applicable  Medication Review Press photographer) Complete

## 2019-02-22 NOTE — Discharge Summary (Signed)
Physician Discharge Summary  Williford [id number] DOB: 03-25-1946 DOA: 02/20/2019  PCP: Mendel Corning  Admit date: 02/20/2019 Discharge date: 02/22/2019  Admitted From: SNF Disposition:  SNF  Discharge Condition:Stable CODE STATUS:FULL Diet recommendation: Heart Healthy  Brief/Interim Summary:  Patient is a 89 male with history of ESRD on dialysis, diabetes, hypertension, stroke, vascular dementia, status post colostomy who was brought out from [facility] facility for the evaluation of altered mental status.  Patient usually resides at [facility]  but recently moved to [facility] facility because of covid outbreak.  Currently patient's mental status is stable.  When he presented he was found to be hyperkalemic.  Nephrology consulted for dialysis and he underwent a session of dialysis.  Patient does not have any symptoms of Covid-19.  He is afebrile, chest x-ray did not show any infiltrates.  He is hemodynamically stable for discharge back  to skilled facility today.  Following problems were addressed during his hospitalization:  Altered mental status: History of vascular dementia, history of a stroke.  As per family he is intermittently confused.  Currently he looks like he is at baseline.  He does not have any signs or symptoms of CNS infection.  CT head did not show any acute intracranial abnormalities.Showed trophy, chronic microvascular ischemic change and large, remote right MCA territory infarct.I donot suspect he has ongoing seizures.  COVID-19: Recently diagnosed with COVID.  Asymptomatic.  Chest x-ray did not show any pneumonia.  Currently saturating fine on room air.  Not started on steroids or remdesivir.  His inflammatory markers are negative except for mildly elevated ddimer.     ESRD on dialysis: Dialyzed on TTS.  Nephrology has been consulted.  Presented with hyperkalemia.  Given lokelma.Underwent dialysis.  Permanent A.  fib: Currently rate is controlled.  On Eliquis for anticoagulation.  Also on amiodarone  History of seizure disorder: On Vimpat and Keppra.Dose of keppra changed to 1000 mg daily.  Mild elevated troponin: Most likely secondary to demand ischemia from end stage renal disease.No work up indicated.  Diabetes type 2:  Hemoglobin A1c of 5.7.Not on medications at home.  Hypertension: Currently blood pressure stable.  Continue current medicines  History of CVA: Continue home medicines.  Normocytic anemia: Associated with CKD.  Currently H&H stable.  Debility/deconditioning: Originally from [facility] due to CVA with residual weakness on the left, at baseline bed bound.  Demented at baseline.  Status post left AKA      Discharge Diagnoses:  Active Problems:   COVID-19 virus infection   ESRD (end stage renal disease) (HCC)   Hyperkalemia   Vascular dementia (HCC)   Anemia due to chronic kidney disease   DM (diabetes mellitus) type II controlled with renal manifestation (HCC)   Essential hypertension   Acute metabolic encephalopathy   Elevated troponin   Hx of AKA (above knee amputation), left [facility])    Discharge Instructions  Discharge Instructions    Diet - low sodium heart healthy   Complete by: As directed    Discharge instructions   Complete by: As directed    1)Continue your medications as instructed.   Increase activity slowly   Complete by: As directed      Allergies as of 02/22/2019   No Known Allergies     Medication List    TAKE these medications   acetaminophen 650 MG CR tablet Commonly known as: TYLENOL Take 650 mg by mouth every 4 (four) hours as  needed for pain or fever.   amiodarone 200 MG tablet Commonly known as: PACERONE Take 200 mg by mouth daily.   atorvastatin 40 MG tablet Commonly known as: LIPITOR Take 40 mg by mouth every evening.   Eliquis 2.5 MG Tabs tablet Generic drug: apixaban Take 2.5 mg by mouth  2 (two) times daily.   lacosamide 50 MG Tabs tablet Commonly known as: VIMPAT Take 50 mg by mouth 2 (two) times daily.   lactulose 10 GM/15ML solution Commonly known as: CHRONULAC Take 80 g by mouth daily as needed (missed dialysis days).   lansoprazole 30 MG capsule Commonly known as: PREVACID Take 30 mg by mouth daily.   levETIRAcetam 1000 MG tablet Commonly known as: KEPPRA Take 1 tablet (1,000 mg total) by mouth daily. What changed: when to take this   midodrine 10 MG tablet Commonly known as: PROAMATINE Take 10 mg by mouth Every Tuesday,Thursday,and Saturday with dialysis.   polyethylene glycol 17 g packet Commonly known as: MIRALAX / GLYCOLAX Take 17 g by mouth daily as needed for mild constipation.   senna 8.6 MG Tabs tablet Commonly known as: SENOKOT Take 2 tablets by mouth every evening.      [place], [place]. Schedule an appointment as soon as possible for a visit in 1 week(s).   Specialty: [place] information: [place] [address] [phone]          No Known Allergies  Consultations:  Nephrology   Procedures/Studies: Ct Head Wo Contrast  Result Date: 02/20/2019 CLINICAL DATA:  Altered level of consciousness today. EXAM: CT HEAD WITHOUT CONTRAST TECHNIQUE: Contiguous axial images were obtained from the base of the skull through the vertex without intravenous contrast. COMPARISON:  None. FINDINGS: Brain: No evidence of acute infarction, hemorrhage, hydrocephalus, extra-axial collection or mass lesion/mass effect. Atrophy, extensive chronic microvascular ischemic change and remote large right MCA infarct noted. Vascular: No hyperdense vessel or unexpected calcification. Skull: No acute or focal abnormality. Sinuses/Orbits: Negative. Other: None. IMPRESSION: No acute abnormality. Atrophy, chronic microvascular ischemic change and large, remote right MCA territory infarct.  Electronically Signed   By: Inge Rise M.D.   On: 02/20/2019 16:55   Dg Chest Port 1 View  Result Date: 02/20/2019 CLINICAL DATA:  The patient missed dialysis 2 days ago. EXAM: PORTABLE CHEST 1 VIEW COMPARISON:  None. FINDINGS: Lungs clear. Heart size normal. No pneumothorax or pleural fluid. No acute or focal bony abnormality. IMPRESSION: No acute disease. Electronically Signed   By: Inge Rise M.D.   On: 02/20/2019 16:53       Subjective: Patient seen and examined the bedside this morning.  Hemodynamically stable for discharge.  Discharge Exam: Vitals:   02/22/19 0800 02/22/19 0821  BP:  139/78  Pulse: 80 78  Resp: 16 17  Temp:  98.5 F (36.9 C)  SpO2: 100% 100%   Vitals:   02/21/19 2351 02/22/19 0438 02/22/19 0800 02/22/19 0821  BP: 112/79 (!) 144/72  139/78  Pulse: 81 74 80 78  Resp: 17 16 16 17   Temp:  97.6 F (36.4 C)  98.5 F (36.9 C)  TempSrc:  Oral  Axillary  SpO2: 100% 100% 100% 100%  Weight:      Height:        General: Pt is alert, awake, not in acute distress,not oriented on baseline Cardiovascular: RRR, S1/S2 +, no rubs, no gallops Respiratory: CTA bilaterally, no wheezing, no rhonchi Abdominal: Soft, NT, ND, bowel  sounds + Extremities: no edema, no cyanosis,left AKA    The results of significant diagnostics from this hospitalization (including imaging, microbiology, ancillary and laboratory) are listed below for reference.     Microbiology: Recent Results (from the past 240 hour(s))  SARS CORONAVIRUS 2 (TAT 6-24 HRS) Nasopharyngeal Nasopharyngeal Swab     Status: Abnormal   Collection Time: 02/20/19  2:31 PM   Specimen: Nasopharyngeal Swab  Result Value Ref Range Status   SARS Coronavirus 2 POSITIVE (A) NEGATIVE Final    Comment: RESULT CALLED TO, READ BACK BY AND VERIFIED WITH: B. OLDLAND,RN 2252 02/20/2019 T. TYSOR (NOTE) SARS-CoV-2 target nucleic acids are DETECTED. The SARS-CoV-2 RNA is generally detectable in upper and  lower respiratory specimens during the acute phase of infection. Positive results are indicative of active infection with SARS-CoV-2. Clinical  correlation with patient history and other diagnostic information is necessary to determine patient infection status. Positive results do  not rule out bacterial infection or co-infection with other viruses. The expected result is Negative. Fact Sheet for Patients: SugarRoll.be Fact Sheet for Healthcare Providers: https://www.woods-mathews.com/ This test is not yet approved or cleared by the [place] FDA and  has been authorized for detection and/or diagnosis of SARS-CoV-2 by FDA under an Emergency Use Authorization (EUA). This EUA will remain  in effect (meaning this test can be used) fo r the duration of the COVID-19 declaration under Section 564(b)(1) of the Act, 21 U.S.C. section 360bbb-3(b)(1), unless the authorization is terminated or revoked sooner. Performed at [facility], [place] [address], [place], [place] [address]   Culture, blood (routine x 2)     Status: None (Preliminary result)   Collection Time: 02/20/19  6:22 PM   Specimen: BLOOD LEFT FOREARM  Result Value Ref Range Status   Specimen Description BLOOD LEFT FOREARM  Final   Special Requests   Final    BOTTLES DRAWN AEROBIC AND ANAEROBIC Blood Culture adequate volume   Culture   Final    NO GROWTH 2 DAYS Performed at [facility], [place] [address], [place], [place] [address]    Report Status PENDING  Incomplete  Culture, blood (Routine X 2) w Reflex to ID Panel     Status: None (Preliminary result)   Collection Time: 02/21/19  2:25 AM   Specimen: BLOOD LEFT ARM  Result Value Ref Range Status   Specimen Description BLOOD LEFT ARM  Final   Special Requests   Final    BOTTLES DRAWN AEROBIC AND ANAEROBIC Blood Culture results may not be optimal due to an excessive volume of blood received in culture bottles   Culture    Final    NO GROWTH 1 DAY Performed at [facility], [place] [address] [place], [place] [address]    Report Status PENDING  Incomplete  Culture, blood (routine x 2)     Status: None (Preliminary result)   Collection Time: 02/21/19  2:44 AM   Specimen: BLOOD LEFT HAND  Result Value Ref Range Status   Specimen Description BLOOD LEFT HAND  Final   Special Requests   Final    BOTTLES DRAWN AEROBIC ONLY Blood Culture adequate volume   Culture   Final    NO GROWTH 1 DAY Performed at [facility], [place] [address] [place], [place] [address]    Report Status PENDING  Incomplete     Labs: BNP (last 3 results) No results for input(s): BNP in the last 8760 hours. Basic Metabolic  Panel: Recent Labs  Lab 02/20/19 1821 02/20/19 1833 02/21/19 0241 02/22/19 0541  NA 139 138 142 139  K 5.6* 5.3* 5.9* 4.3  CL 105 109 105 100  CO2 17*  --  19* 25  GLUCOSE 91 87 92 91  BUN 115* 116* 117* 46*  CREATININE 13.82* 14.20* 14.40* 7.81*  CALCIUM 8.4*  --  8.6* 8.6*  MG 2.1  --  2.1 2.2  PHOS 5.5*  --   --   --    Liver Function Tests: Recent Labs  Lab 02/20/19 1821 02/21/19 0241 02/22/19 0541  AST 12* 10* 13*  ALT 25 21 19   ALKPHOS 58 59 61  BILITOT 0.8 0.9 0.8  PROT 7.5 7.3 7.2  ALBUMIN 3.7 3.5 3.5   No results for input(s): LIPASE, AMYLASE in the last 168 hours. No results for input(s): AMMONIA in the last 168 hours. CBC: Recent Labs  Lab 02/20/19 1821 02/20/19 1833 02/21/19 0241 02/21/19 1256 02/22/19 0541  WBC 3.5*  --  2.6* 2.5* 2.7*  NEUTROABS 1.8  --  1.1*  --  1.4*  HGB 8.9* 9.5* 8.8* 8.7* 8.6*  HCT 28.1* 28.0* 27.1* 25.0* 26.9*  MCV 98.9  --  97.8 92.9 97.8  PLT 154  --  164 157 166   Cardiac Enzymes: No results for input(s): CKTOTAL, CKMB, CKMBINDEX, TROPONINI in the last 168 hours. BNP: Invalid input(s): POCBNP CBG: Recent Labs  Lab 02/21/19 2350 02/22/19 0427 02/22/19 0435 02/22/19 0449 02/22/19 0805  GLUCAP 132* 33* 85 85 76    D-Dimer Recent Labs    02/21/19 0241 02/22/19 0541  DDIMER 2.67* 2.38*   Hgb A1c Recent Labs    02/21/19 0241  HGBA1C 5.7*   Lipid Profile No results for input(s): CHOL, HDL, LDLCALC, TRIG, CHOLHDL, LDLDIRECT in the last 72 hours. Thyroid function studies No results for input(s): TSH, T4TOTAL, T3FREE, THYROIDAB in the last 72 hours.  Invalid input(s): FREET3 Anemia work up Recent Labs    02/21/19 0241 02/22/19 0541  VITAMINB12 1,017*  --   FOLATE 13.2  --   FERRITIN 697* 841*  TIBC 182*  --   IRON 71  --   RETICCTPCT 0.5  --    Urinalysis No results found for: COLORURINE, APPEARANCEUR, LABSPEC, [place], GLUCOSEU, HGBUR, BILIRUBINUR, KETONESUR, PROTEINUR, UROBILINOGEN, NITRITE, LEUKOCYTESUR Sepsis Labs Invalid input(s): PROCALCITONIN,  WBC,  LACTICIDVEN Microbiology Recent Results (from the past 240 hour(s))  SARS CORONAVIRUS 2 (TAT 6-24 HRS) Nasopharyngeal Nasopharyngeal Swab     Status: Abnormal   Collection Time: 02/20/19  2:31 PM   Specimen: Nasopharyngeal Swab  Result Value Ref Range Status   SARS Coronavirus 2 POSITIVE (A) NEGATIVE Final    Comment: RESULT CALLED TO, READ BACK BY AND VERIFIED WITH: B. OLDLAND,RN 2252 02/20/2019 T. TYSOR (NOTE) SARS-CoV-2 target nucleic acids are DETECTED. The SARS-CoV-2 RNA is generally detectable in upper and lower respiratory specimens during the acute phase of infection. Positive results are indicative of active infection with SARS-CoV-2. Clinical  correlation with patient history and other diagnostic information is necessary to determine patient infection status. Positive results do  not rule out bacterial infection or co-infection with other viruses. The expected result is Negative. Fact Sheet for Patients: SugarRoll.be Fact Sheet for Healthcare Providers: https://www.woods-mathews.com/ This test is not yet approved or cleared by the [place] FDA and  has been  authorized for detection and/or diagnosis of SARS-CoV-2 by FDA under an Emergency Use Authorization (EUA). This EUA will remain  in effect (meaning  this test can be used) fo r the duration of the COVID-19 declaration under Section 564(b)(1) of the Act, 21 U.S.C. section 360bbb-3(b)(1), unless the authorization is terminated or revoked sooner. Performed at [facility], [place] [address], [place], [place] [address]   Culture, blood (routine x 2)     Status: None (Preliminary result)   Collection Time: 02/20/19  6:22 PM   Specimen: BLOOD LEFT FOREARM  Result Value Ref Range Status   Specimen Description BLOOD LEFT FOREARM  Final   Special Requests   Final    BOTTLES DRAWN AEROBIC AND ANAEROBIC Blood Culture adequate volume   Culture   Final    NO GROWTH 2 DAYS Performed at [facility], [place] [address], [place], [place] [address]    Report Status PENDING  Incomplete  Culture, blood (Routine X 2) w Reflex to ID Panel     Status: None (Preliminary result)   Collection Time: 02/21/19  2:25 AM   Specimen: BLOOD LEFT ARM  Result Value Ref Range Status   Specimen Description BLOOD LEFT ARM  Final   Special Requests   Final    BOTTLES DRAWN AEROBIC AND ANAEROBIC Blood Culture results may not be optimal due to an excessive volume of blood received in culture bottles   Culture   Final    NO GROWTH 1 DAY Performed at [facility], [place] [address] [place], [place] [address]    Report Status PENDING  Incomplete  Culture, blood (routine x 2)     Status: None (Preliminary result)   Collection Time: 02/21/19  2:44 AM   Specimen: BLOOD LEFT HAND  Result Value Ref Range Status   Specimen Description BLOOD LEFT HAND  Final   Special Requests   Final    BOTTLES DRAWN AEROBIC ONLY Blood Culture adequate volume   Culture   Final    NO GROWTH 1 DAY Performed at [facility], [place] [address] [place], [place] [address]    Report Status PENDING  Incomplete    Please  note: You were cared for by a hospitalist during your hospital stay. Once you are discharged, your primary care physician will handle any further medical issues. Please note that NO REFILLS for any discharge medications will be authorized once you are discharged, as it is imperative that you return to your primary care physician (or establish a relationship with a primary care physician if you do not have one) for your post hospital discharge needs so that they can reassess your need for medications and monitor your lab values.    Time coordinating discharge: 40 minutes  SIGNED:   Shelly Coss, MD  [facility] 02/22/2019, 10:56 AM Pager [phone]  If 7PM-7AM, please contact night-coverage [email or web address] Password [email or web address]

## 2019-02-22 NOTE — Progress Notes (Signed)
Renal Navigator received message from weekend CSW/C. Pinion wanting to know where patient received OP HD treatment. Renal Navigator notes that patient is not in [facility] system and contacted [place] ([facility] for [place]). Renal Navigator confirmed that patient is set up to treat at [facility], though he has not yet received a treatment there. His schedule is TTS 11:00am and should arrive at 11:00am.  Renal Navigator questions what patient's transportation to/from HD/[facility] [place] as this SNF does not have their own transportation. In the past, this SNF has not been able to provide transportation to COVID positive patients. Renal Navigator spoke with CSW/L. Paisley to discuss. CSW to contact SNF.  Logan Singh, [place] Renal Navigator [phone]

## 2019-02-22 NOTE — Progress Notes (Signed)
Report given to Financial trader at [employer].

## 2019-02-23 LAB — GLUCOSE, CAPILLARY: Glucose-Capillary: 33 mg/dL — CL (ref 70–99)

## 2019-02-25 LAB — CULTURE, BLOOD (ROUTINE X 2)
Culture: NO GROWTH
Special Requests: ADEQUATE

## 2019-02-26 LAB — CULTURE, BLOOD (ROUTINE X 2)
Culture: NO GROWTH
Culture: NO GROWTH
Special Requests: ADEQUATE

## 2019-09-11 DEATH — deceased

## 2021-04-20 IMAGING — DX DG CHEST 1V PORT
1 series · 1 of 1 positions shown · non-contrast
Comparison: None.

CLINICAL DATA: The patient missed dialysis 2 days ago.

EXAM:
PORTABLE CHEST 1 VIEW

[chest ap]
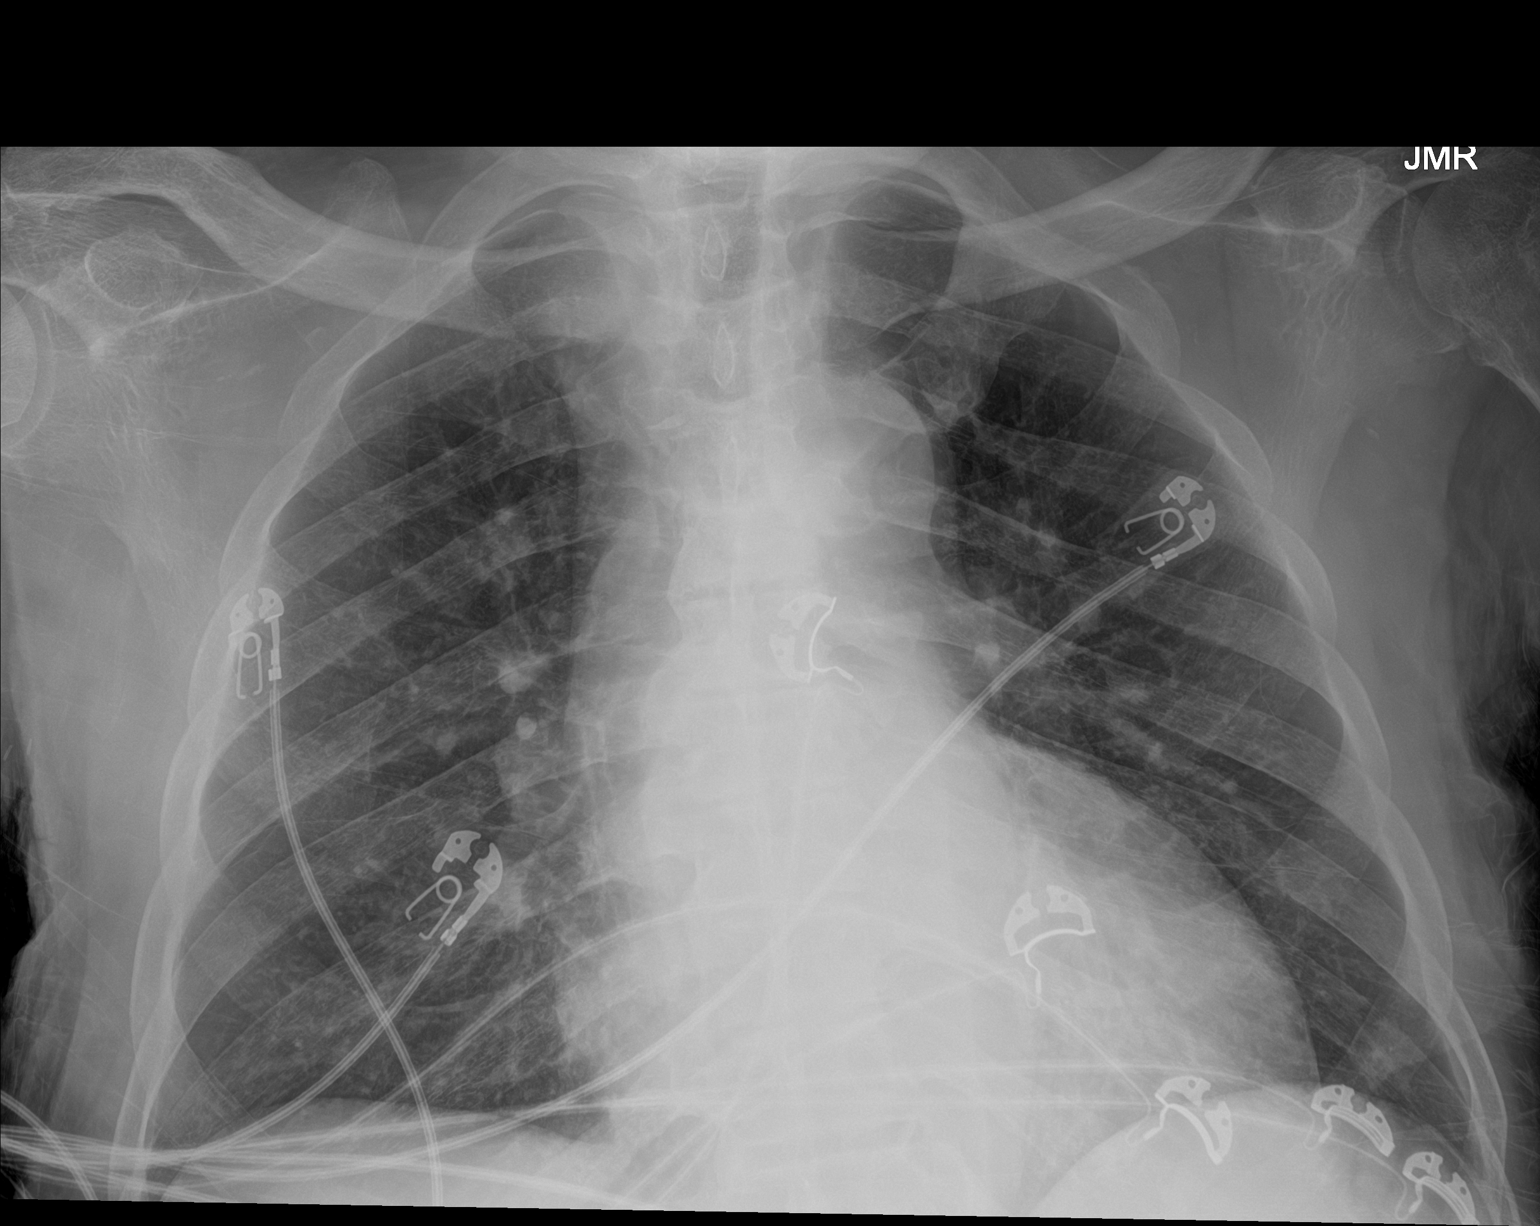

[1 of 1 positions shown; findings below may reference images not displayed]

FINDINGS: Lungs clear. Heart size normal. No pneumothorax or pleural fluid. No
acute or focal bony abnormality.
IMPRESSION: No acute disease.
